# Patient Record
Sex: Female | Born: 1970 | Race: White | Hispanic: No | Marital: Married | Smoking: Never smoker
Health system: Southern US, Community
[De-identification: ages and names within clinical notes are randomized; demographics above are authoritative.]

## PROBLEM LIST (undated history)

## (undated) DIAGNOSIS — I7774 Dissection of vertebral artery: Secondary | ICD-10-CM

## (undated) DIAGNOSIS — R51 Headache: Secondary | ICD-10-CM

## (undated) DIAGNOSIS — R519 Headache, unspecified: Secondary | ICD-10-CM

## (undated) HISTORY — DX: Headache, unspecified: R51.9

## (undated) HISTORY — DX: Dissection of vertebral artery: I77.74

## (undated) HISTORY — DX: Headache: R51

## (undated) HISTORY — PX: WISDOM TOOTH EXTRACTION: SHX21

---

## 1995-03-02 HISTORY — PX: TONSILLECTOMY: SUR1361

## 1998-07-30 ENCOUNTER — Inpatient Hospital Stay (HOSPITAL_COMMUNITY): Admission: AD | Admit: 1998-07-30 | Discharge: 1998-08-01 | Payer: Self-pay | Admitting: *Deleted

## 1998-08-02 ENCOUNTER — Encounter (HOSPITAL_COMMUNITY): Admission: RE | Admit: 1998-08-02 | Discharge: 1998-10-31 | Payer: Self-pay | Admitting: *Deleted

## 2000-12-14 ENCOUNTER — Other Ambulatory Visit: Admission: RE | Admit: 2000-12-14 | Discharge: 2000-12-14 | Payer: Self-pay | Admitting: *Deleted

## 2001-07-11 ENCOUNTER — Inpatient Hospital Stay (HOSPITAL_COMMUNITY): Admission: AD | Admit: 2001-07-11 | Discharge: 2001-07-13 | Payer: Self-pay | Admitting: Obstetrics and Gynecology

## 2001-08-15 ENCOUNTER — Other Ambulatory Visit: Admission: RE | Admit: 2001-08-15 | Discharge: 2001-08-15 | Payer: Self-pay | Admitting: Obstetrics and Gynecology

## 2002-09-25 ENCOUNTER — Other Ambulatory Visit: Admission: RE | Admit: 2002-09-25 | Discharge: 2002-09-25 | Payer: Self-pay | Admitting: Obstetrics and Gynecology

## 2003-12-09 ENCOUNTER — Other Ambulatory Visit: Admission: RE | Admit: 2003-12-09 | Discharge: 2003-12-09 | Payer: Self-pay | Admitting: Obstetrics and Gynecology

## 2004-05-08 ENCOUNTER — Inpatient Hospital Stay (HOSPITAL_COMMUNITY): Admission: AD | Admit: 2004-05-08 | Discharge: 2004-05-15 | Payer: Self-pay | Admitting: Obstetrics and Gynecology

## 2004-05-08 ENCOUNTER — Ambulatory Visit: Payer: Self-pay | Admitting: Neonatology

## 2004-05-13 ENCOUNTER — Encounter (INDEPENDENT_AMBULATORY_CARE_PROVIDER_SITE_OTHER): Payer: Self-pay | Admitting: Specialist

## 2004-07-17 ENCOUNTER — Other Ambulatory Visit: Admission: RE | Admit: 2004-07-17 | Discharge: 2004-07-17 | Payer: Self-pay | Admitting: Obstetrics and Gynecology

## 2005-05-09 ENCOUNTER — Emergency Department (HOSPITAL_COMMUNITY): Admission: EM | Admit: 2005-05-09 | Discharge: 2005-05-10 | Payer: Self-pay | Admitting: Emergency Medicine

## 2009-04-18 ENCOUNTER — Ambulatory Visit (HOSPITAL_COMMUNITY): Admission: RE | Admit: 2009-04-18 | Discharge: 2009-04-18 | Payer: Self-pay | Admitting: Neurosurgery

## 2010-07-17 NOTE — Discharge Summary (Signed)
Ruth Cooper, Ruth Cooper            ACCOUNT NO.:  [id number]   MEDICAL RECORD NO.:  [id number]          PATIENT TYPE:  INP   LOCATION:  9148                          FACILITY:  [facility]   PHYSICIAN:  Michelle L. Grewal, M.D.DATE OF BIRTH:  1970-07-13   DATE OF ADMISSION:  05/08/2004  DATE OF DISCHARGE:  05/15/2004                                 DISCHARGE SUMMARY   ADMITTING DIAGNOSES:  1.  Intrauterine pregnancy at 51 and five-sevenths weeks estimated      gestational age.  2.  Pregnancy-induced hypertension.   DISCHARGE DIAGNOSES:  1.  Status post spontaneous vaginal delivery.  2.  Viable female infant.  3.  Chronic hypertension.  4.  Hemorrhoids.   PROCEDURE:  Spontaneous vaginal delivery.   REASON FOR ADMISSION:  Please see written H&P.   HOSPITAL COURSE:  The patient was a 40 year old gravida 3, para 2 married  white female that was admitted to [facility] at 62 and five-  sevenths weeks estimated gestational age for observation. The patient had  been seen in the office earlier on the day of admission with blood pressure  noted to be 138/100 with 1+ proteinuria. The patient did complain of a  headache and some increase in pedal edema. On admission, blood pressure was  150-163 over 87-100. Deep tendon reflexes were 2+ without clonus. Fetal  heart tones were reactive. The patient was placed on the monitor without  uterine contractions noted. Laboratory findings revealed platelet count of  119, SGOT 54, SGPT 55. Ultrasound revealed vertex presentation with AFI of  8.3, estimated fetal weight of 1828 g. The patient was admitted for external  fetal monitoring, betamethasone administration, serial lab work, and 24-hour  urine collection. 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  Amniocentesis was performed for evaluation of fetal maturity. Biophysical  profile was 8/8, L/S ratio  came back at 1.8:1 without PG. However, due to  worsening PIH with borderline lecithin/sphingomyelin ratio, decision was  made to proceed with induction of labor. The patient did progress to full  dilatation with spontaneous vaginal delivery of a viable female infant  weighing 3 pounds 8 ounces, Apgars of 8 at one minute, 10 at five minutes.  Arterial cord pH was 7.14. NICU was in attendance. Placenta was sent for  pathology, which was delivered spontaneously. Estimated blood loss was less  than 400 cc. The patient did sustain a second-degree perineal laceration was  repaired without difficulty. The patient and infant were in excellent  immediate postpartum condition. On postpartum day #1 the patient did  complain of some heartburn. She denied headache or blurred vision. Vital  signs were stable. Blood pressure 137/87 to 166/89. Deep tendon reflexes 2-  3+ without clonus. Abdomen was soft. Fundus was firm with a mild amount of  lochia. Perineum was intact. Laboratory findings revealed hemoglobin of  12.6; platelet count 164,000; wbc count of 13.2. Blood pressure was checked  often with PIH labs ordered for the afternoon. The patient was given some  Pepcid a.c. and h.s. Later that  afternoon, vital signs were stable with  blood pressure noted to be 155/90 to 132/87. Laboratory findings revealed  platelets up to 165,000. SGOT was down to 39, SGPT down to 46. On the  following morning, the patient was doing well with exception of a complaint  of a hemorrhoid. Vital signs were stable, blood pressure was improved. The  patient was noted to have a swollen, nonthrombosed hemorrhoid. Fundus was  firm and nontender, labs were improving, and the patient was later  discharged.   CONDITION ON DISCHARGE:  Stable.   DIET:  Regular as tolerated.   ACTIVITY:  No vaginal entry and bedrest.   FOLLOW UP:  The patient is to follow up in the office in approximately 4  days for lab work and blood pressure  check. She is to call for headache,  blurred vision, or right upper quadrant pain, or heavy vaginal bleeding.   DISCHARGE MEDICATIONS:  1.  Prenatal vitamins one p.o. daily.  2.  Labetalol 100 mg one p.o. b.i.d.  3.  Ibuprofen 600 mg every 6 hours p.r.n. cramps.      CC/MEDQ  D:  06/26/2004  T:  06/26/2004  Job:  [id number]

## 2011-06-14 ENCOUNTER — Encounter (INDEPENDENT_AMBULATORY_CARE_PROVIDER_SITE_OTHER): Payer: Self-pay | Admitting: Surgery

## 2013-05-28 ENCOUNTER — Ambulatory Visit (HOSPITAL_BASED_OUTPATIENT_CLINIC_OR_DEPARTMENT_OTHER)
Admission: RE | Admit: 2013-05-28 | Discharge: 2013-05-28 | Disposition: A | Discharge status: Home / Self Care | Payer: BC Managed Care – PPO | Source: Ambulatory Visit | Attending: Physician Assistant | Admitting: Physician Assistant

## 2013-05-28 ENCOUNTER — Encounter: Payer: Self-pay | Admitting: Physician Assistant

## 2013-05-28 ENCOUNTER — Ambulatory Visit (INDEPENDENT_AMBULATORY_CARE_PROVIDER_SITE_OTHER): Payer: BC Managed Care – PPO | Admitting: Physician Assistant

## 2013-05-28 VITALS — BP 118/79 | HR 76 | Temp 98.4°F | Resp 14 | Ht 66.0 in | Wt 141.4 lb

## 2013-05-28 DIAGNOSIS — M25469 Effusion, unspecified knee: Secondary | ICD-10-CM | POA: Insufficient documentation

## 2013-05-28 DIAGNOSIS — Z23 Encounter for immunization: Secondary | ICD-10-CM

## 2013-05-28 DIAGNOSIS — M25569 Pain in unspecified knee: Secondary | ICD-10-CM | POA: Insufficient documentation

## 2013-05-28 DIAGNOSIS — M25561 Pain in right knee: Secondary | ICD-10-CM

## 2013-05-28 DIAGNOSIS — Z7189 Other specified counseling: Secondary | ICD-10-CM

## 2013-05-28 DIAGNOSIS — Z7689 Persons encountering health services in other specified circumstances: Secondary | ICD-10-CM

## 2013-05-28 DIAGNOSIS — Z1239 Encounter for other screening for malignant neoplasm of breast: Secondary | ICD-10-CM

## 2013-05-28 NOTE — Progress Notes (Signed)
Patient presents to clinic today to establish care.  Patient complains of 1.5 weeks of pain in right knee without effusion after falling onto right knee.  Pain is intermittent and achy in nature.  Patient denies trauma to other extremity or to head during fall.  Fall was not preceded by [facility], dizziness or syncope. Denies twisting injury. Pain is present with use of stairs.  Walking down stairs is worse.  Has not taken anything for pain.  Denies pain elsewhere.  Health Maintenance: Dental -- UTD Vision -- Overdue Immunizations -- Flu shot in October; Needs Td booster Mammogram --referral to [facility] for Mammogram PAP -- 2009; no abnormal PAPs  Past Medical History  Diagnosis Date  . Frequent headaches   . Vertebral artery dissection     Past Surgical History  Procedure Laterality Date  . Tonsillectomy  1997  . Wisdom tooth extraction      No current outpatient prescriptions on file prior to visit.   No current facility-administered medications on file prior to visit.    No Known Allergies  Family History  Problem Relation Age of Onset  . Colon cancer Mother     Living  . Colon cancer Maternal Grandfather   . Breast cancer Mother   . Breast cancer Maternal Grandmother   . Heart disease Paternal Grandfather   . Healthy Father     Living  . Hypertension Paternal Grandfather   . Alzheimer's disease Paternal Grandmother   . Healthy Brother     x1  . Healthy Son     x2  . Allergies Son   . Healthy Daughter     x1    History   Social History  . Marital Status: Married    Spouse Name: N/A    Number of Children: N/A  . Years of Education: N/A   Occupational History  . Not on file.   Social History Main Topics  . Smoking status: Never Smoker   . Smokeless tobacco: Never Used  . Alcohol Use: No  . Drug Use: No  . Sexual Activity: Not Currently   Other Topics Concern  . Not on file   Social History Narrative   Patient works as Contractor.       Patient currently married with 3 children, all healthy.       -- 15, 12, 9            Review of Systems  Constitutional: Negative for fever and weight loss.  HENT: Negative for ear discharge, ear pain, hearing loss and tinnitus.   Eyes: Positive for blurred vision. Negative for double vision, photophobia and pain.  Respiratory: Negative for cough and shortness of breath.   Cardiovascular: Negative for chest pain and palpitations.  Gastrointestinal: Negative for heartburn, nausea, vomiting, abdominal pain, diarrhea, constipation, blood in stool and melena.  Genitourinary: Negative for dysuria, urgency, frequency, hematuria and flank pain.  Neurological: Negative for dizziness and loss of consciousness.  Psychiatric/Behavioral: Negative for depression, suicidal ideas, hallucinations and substance abuse. The patient is not nervous/anxious and does not have insomnia.    BP 118/79  Pulse 76  Temp(Src) 98.4 F (36.9 C) (Oral)  Resp 14  Ht 5\' 6"  (1.676 m)  Wt 141 lb 6 oz (64.127 kg)  BMI 22.83 kg/m2  SpO2 98%  LMP 05/18/2013  Physical Exam  Vitals reviewed. Constitutional: She is oriented to person, place, and time and well-developed, well-nourished, and in no distress.  HENT:  Head: Normocephalic and atraumatic.  Right Ear: External ear normal.  Left Ear: External ear normal.  Nose: Nose normal.  Mouth/Throat: Oropharynx is clear and moist. No oropharyngeal exudate.  Eyes: Conjunctivae are normal. Pupils are equal, round, and reactive to light.  Neck: Neck supple.  Cardiovascular: Normal rate, regular rhythm, normal heart sounds and intact distal pulses.   Pulmonary/Chest: Effort normal and breath sounds normal. No respiratory distress. She has no wheezes. She has no rales. She exhibits no tenderness.  Musculoskeletal:       Right hip: Normal.       Right knee: Normal.       Right ankle: Normal.       Right upper leg: Normal.       Right lower leg: Normal.       Right  foot: Normal.  Lymphadenopathy:    She has no cervical adenopathy.  Neurological: She is alert and oriented to person, place, and time.  Skin: Skin is warm and dry. No rash noted.  Psychiatric: Affect normal.   Assessment/Plan: Encounter to establish care Medical history reviewed.  PAP overdue. Patient to schedule appointment for annual exam and pelvic exam.  Patient to come fasting for labs.  Tetanus booster given by nursing staff. [facility].  Knee pain, right Physical exam unremarkable.  Will obtain x-ray.  Knee support brace given.  Alternate tylenol and ibuprofen.  Topical Aspercreme. Activity as tolerated.  Discussed PT as an option.  Patient declines at present.  If symptoms persist, will refer to Orthopedics for MRI and further treatment.  Need for prophylactic vaccination with tetanus-diphtheria (TD) Immunization given by nursing staff.

## 2013-05-28 NOTE — Patient Instructions (Signed)
Please go to the [facility].  The address is [address]  The Imaging department is located on the first floor, just right of the elevator.  I will call you with your results.  Please wear knee brace daily.  May take off at home while resting.  Keep leg elevated.  Alternate tylenol and ibuprofen if needed for pain.  Apply heat to knee for 15 minutes, 3-4 times per day. If symptoms persist, we will need to obtain MRI and send you to see an orthopedist.  Follow-up in 2-3 weeks.  Also please schedule appointment with annual exam with fasting labs.  Please consider Mammogram.  It was a pleasure participating in your care today.  Preventive Care for Adults, Female A healthy lifestyle and preventive care can promote health and wellness. Preventive health guidelines for women include the following key practices.  A routine yearly physical is a good way to check with your health care provider about your health and preventive screening. It is a chance to share any concerns and updates on your health and to receive a thorough exam.  Visit your dentist for a routine exam and preventive care every 6 months. Brush your teeth twice a day and floss once a day. Good oral hygiene prevents tooth decay and gum disease.  The frequency of eye exams is based on your age, health, family medical history, use of contact lenses, and other factors. Follow your health care provider's recommendations for frequency of eye exams.  Eat a healthy diet. Foods like vegetables, fruits, whole grains, low-fat dairy products, and lean protein foods contain the nutrients you need without too many calories. Decrease your intake of foods high in solid fats, added sugars, and salt. Eat the right amount of calories for you.Get information about a proper diet from your health care provider, if necessary.  Regular physical exercise is one of the most important things you can do for your health. Most adults should get at least  150 minutes of moderate-intensity exercise (any activity that increases your heart rate and causes you to sweat) each week. In addition, most adults need muscle-strengthening exercises on 2 or more days a week.  Maintain a healthy weight. The body mass index (BMI) is a screening tool to identify possible weight problems. It provides an estimate of body fat based on height and weight. Your health care provider can find your BMI, and can help you achieve or maintain a healthy weight.For adults 20 years and older:  A BMI below 18.5 is considered underweight.  A BMI of 18.5 to 24.9 is normal.  A BMI of 25 to 29.9 is considered overweight.  A BMI of 30 and above is considered obese.  Maintain normal blood lipids and cholesterol levels by exercising and minimizing your intake of saturated fat. Eat a balanced diet with plenty of fruit and vegetables. Blood tests for lipids and cholesterol should begin at age 74 and be repeated every 5 years. If your lipid or cholesterol levels are high, you are over 50, or you are at high risk for heart disease, you may need your cholesterol levels checked more frequently.Ongoing high lipid and cholesterol levels should be treated with medicines if diet and exercise are not working.  If you smoke, find out from your health care provider how to quit. If you do not use tobacco, do not start.  Lung cancer screening is recommended for adults aged 59 80 years who are at high risk for developing lung  cancer because of a history of smoking. A yearly low-dose CT scan of the lungs is recommended for people who have at least a 30-pack-year history of smoking and are a current smoker or have quit within the past 15 years. A pack year of smoking is smoking an average of 1 pack of cigarettes a day for 1 year (for example: 1 pack a day for 30 years or 2 packs a day for 15 years). Yearly screening should continue until the smoker has stopped smoking for at least 15 years. Yearly  screening should be stopped for people who develop a health problem that would prevent them from having lung cancer treatment.  If you are pregnant, do not drink alcohol. If you are breastfeeding, be very cautious about drinking alcohol. If you are not pregnant and choose to drink alcohol, do not have more than 1 drink per day. One drink is considered to be 12 ounces (355 mL) of beer, 5 ounces (148 mL) of wine, or 1.5 ounces (44 mL) of liquor.  Avoid use of street drugs. Do not share needles with anyone. Ask for help if you need support or instructions about stopping the use of drugs.  High blood pressure causes heart disease and increases the risk of stroke. Your blood pressure should be checked at least every 1 to 2 years. Ongoing high blood pressure should be treated with medicines if weight loss and exercise do not work.  If you are 43 43 years old, ask your health care provider if you should take aspirin to prevent strokes.  Diabetes screening involves taking a blood sample to check your fasting blood sugar level. This should be done once every 3 years, after age 26, if you are within normal weight and without risk factors for diabetes. Testing should be considered at a younger age or be carried out more frequently if you are overweight and have at least 1 risk factor for diabetes.  Breast cancer screening is essential preventive care for women. You should practice "breast self-awareness." This means understanding the normal appearance and feel of your breasts and may include breast self-examination. Any changes detected, no matter how small, should be reported to a health care provider. Women in their 75s and 30s should have a clinical breast exam (CBE) by a health care provider as part of a regular health exam every 1 to 3 years. After age 29, women should have a CBE every year. Starting at age 59, women should consider having a mammogram (breast X-ray test) every year. Women who have a family  history of breast cancer should talk to their health care provider about genetic screening. Women at a high risk of breast cancer should talk to their health care providers about having an MRI and a mammogram every year.  Breast cancer gene (BRCA)-related cancer risk assessment is recommended for women who have family members with BRCA-related cancers. BRCA-related cancers include breast, ovarian, tubal, and peritoneal cancers. Having family members with these cancers may be associated with an increased risk for harmful changes (mutations) in the breast cancer genes BRCA1 and BRCA2. Results of the assessment will determine the need for genetic counseling and BRCA1 and BRCA2 testing.  The Pap test is a screening test for cervical cancer. A Pap test can show cell changes on the cervix that might become cervical cancer if left untreated. A Pap test is a procedure in which cells are obtained and examined from the lower end of the uterus (cervix).  Women should  have a Pap test starting at age 10.  Between ages 66 and 40, Pap tests should be repeated every 2 years.  Beginning at age 68, you should have a Pap test every 3 years as long as the past 3 Pap tests have been normal.  Some women have medical problems that increase the chance of getting cervical cancer. Talk to your health care provider about these problems. It is especially important to talk to your health care provider if a new problem develops soon after your last Pap test. In these cases, your health care provider may recommend more frequent screening and Pap tests.  The above recommendations are the same for women who have or have not gotten the vaccine for human papillomavirus (HPV).  If you had a hysterectomy for a problem that was not cancer or a condition that could lead to cancer, then you no longer need Pap tests. Even if you no longer need a Pap test, a regular exam is a good idea to make sure no other problems are starting.  If you  are between ages 37 and 22 years, and you have had normal Pap tests going back 10 years, you no longer need Pap tests. Even if you no longer need a Pap test, a regular exam is a good idea to make sure no other problems are starting.  If you have had past treatment for cervical cancer or a condition that could lead to cancer, you need Pap tests and screening for cancer for at least 20 years after your treatment.  If Pap tests have been discontinued, risk factors (such as a new sexual partner) need to be reassessed to determine if screening should be resumed.  The HPV test is an additional test that may be used for cervical cancer screening. The HPV test looks for the virus that can cause the cell changes on the cervix. The cells collected during the Pap test can be tested for HPV. The HPV test could be used to screen women aged 82 years and older, and should be used in women of any age who have unclear Pap test results. After the age of 32, women should have HPV testing at the same frequency as a Pap test.  Colorectal cancer can be detected and often prevented. Most routine colorectal cancer screening begins at the age of 49 years and continues through age 58 years. However, your health care provider may recommend screening at an earlier age if you have risk factors for colon cancer. On a yearly basis, your health care provider may provide home test kits to check for hidden blood in the stool. Use of a small camera at the end of a tube, to directly examine the colon (sigmoidoscopy or colonoscopy), can detect the earliest forms of colorectal cancer. Talk to your health care provider about this at age 48, when routine screening begins. Direct exam of the colon should be repeated every 5 10 years through age 29 years, unless early forms of pre-cancerous polyps or small growths are found.  People who are at an increased risk for hepatitis B should be screened for this virus. You are considered at high risk for  hepatitis B if:  You were born in a country where hepatitis B occurs often. Talk with your health care provider about which countries are considered high risk.  Your parents were born in a high-risk country and you have not received a shot to protect against hepatitis B (hepatitis B vaccine).  You have  HIV or AIDS.  You use needles to inject street drugs.  You live with, or have sex with, someone who has Hepatitis B.  You get hemodialysis treatment.  You take certain medicines for conditions like cancer, organ transplantation, and autoimmune conditions.  Hepatitis C blood testing is recommended for all people born from [birth year] through 1965 and any individual with known risks for hepatitis C.  Practice safe sex. Use condoms and avoid high-risk sexual practices to reduce the spread of sexually transmitted infections (STIs). STIs include gonorrhea, chlamydia, syphilis, trichomonas, herpes, HPV, and human immunodeficiency virus (HIV). Herpes, HIV, and HPV are viral illnesses that have no cure. They can result in disability, cancer, and death. Sexually active women aged 4 years and younger should be checked for chlamydia. Older women with new or multiple partners should also be tested for chlamydia. Testing for other STIs is recommended if you are sexually active and at increased risk.  Osteoporosis is a disease in which the bones lose minerals and strength with aging. This can result in serious bone fractures or breaks. The risk of osteoporosis can be identified using a bone density scan. Women ages 36 years and over and women at risk for fractures or osteoporosis should discuss screening with their health care providers. Ask your health care provider whether you should take a calcium supplement or vitamin D to reduce the rate of osteoporosis.  Menopause can be associated with physical symptoms and risks. Hormone replacement therapy is available to decrease symptoms and risks. You should talk to your  health care provider about whether hormone replacement therapy is right for you.  Use sunscreen. Apply sunscreen liberally and repeatedly throughout the day. You should seek shade when your shadow is shorter than you. Protect yourself by wearing long sleeves, pants, a wide-brimmed hat, and sunglasses year round, whenever you are outdoors.  Once a month, do a whole body skin exam, using a mirror to look at the skin on your back. Tell your health care provider of new moles, moles that have irregular borders, moles that are larger than a pencil eraser, or moles that have changed in shape or color.  Stay current with required vaccines (immunizations).  Influenza vaccine. All adults should be immunized every year.  Tetanus, diphtheria, and acellular pertussis (Td, Tdap) vaccine. Pregnant women should receive 1 dose of Tdap vaccine during each pregnancy. The dose should be obtained regardless of the length of time since the last dose. Immunization is preferred during the 27th 36th week of gestation. An adult who has not previously received Tdap or who does not know her vaccine status should receive 1 dose of Tdap. This initial dose should be followed by tetanus and diphtheria toxoids (Td) booster doses every 10 years. Adults with an unknown or incomplete history of completing a 3-dose immunization series with Td-containing vaccines should begin or complete a primary immunization series including a Tdap dose. Adults should receive a Td booster every 10 years.  Varicella vaccine. An adult without evidence of immunity to varicella should receive 2 doses or a second dose if she has previously received 1 dose. Pregnant females who do not have evidence of immunity should receive the first dose after pregnancy. This first dose should be obtained before leaving the health care facility. The second dose should be obtained 4 8 weeks after the first dose.  Human papillomavirus (HPV) vaccine. Females aged 86 26 years  who have not received the vaccine previously should obtain the 3-dose series. The vaccine is  not recommended for use in pregnant females. However, pregnancy testing is not needed before receiving a dose. If a female is found to be pregnant after receiving a dose, no treatment is needed. In that case, the remaining doses should be delayed until after the pregnancy. Immunization is recommended for any person with an immunocompromised condition through the age of 27 years if she did not get any or all doses earlier. During the 3-dose series, the second dose should be obtained 4 8 weeks after the first dose. The third dose should be obtained 24 weeks after the first dose and 16 weeks after the second dose.  Zoster vaccine. One dose is recommended for adults aged 36 years or older unless certain conditions are present.  Measles, mumps, and rubella (MMR) vaccine. Adults born before [birth year] generally are considered immune to measles and mumps. Adults born in [birth year] or later should have 1 or more doses of MMR vaccine unless there is a contraindication to the vaccine or there is laboratory evidence of immunity to each of the three diseases. A routine second dose of MMR vaccine should be obtained at least 28 days after the first dose for students attending postsecondary schools, health care workers, or international travelers. People who received inactivated measles vaccine or an unknown type of measles vaccine during 1963 1967 should receive 2 doses of MMR vaccine. People who received inactivated mumps vaccine or an unknown type of mumps vaccine before 1979 and are at high risk for mumps infection should consider immunization with 2 doses of MMR vaccine. For females of childbearing age, rubella immunity should be determined. If there is no evidence of immunity, females who are not pregnant should be vaccinated. If there is no evidence of immunity, females who are pregnant should delay immunization until after pregnancy.  Unvaccinated health care workers born before [birth year] who lack laboratory evidence of measles, mumps, or rubella immunity or laboratory confirmation of disease should consider measles and mumps immunization with 2 doses of MMR vaccine or rubella immunization with 1 dose of MMR vaccine.  Pneumococcal 13-valent conjugate (PCV13) vaccine. When indicated, a person who is uncertain of her immunization history and has no record of immunization should receive the PCV13 vaccine. An adult aged 13 years or older who has certain medical conditions and has not been previously immunized should receive 1 dose of PCV13 vaccine. This PCV13 should be followed with a dose of pneumococcal polysaccharide (PPSV23) vaccine. The PPSV23 vaccine dose should be obtained at least 8 weeks after the dose of PCV13 vaccine. An adult aged 5 years or older who has certain medical conditions and previously received 1 or more doses of PPSV23 vaccine should receive 1 dose of PCV13. The PCV13 vaccine dose should be obtained 1 or more years after the last PPSV23 vaccine dose.  Pneumococcal polysaccharide (PPSV23) vaccine. When PCV13 is also indicated, PCV13 should be obtained first. All adults aged 7 years and older should be immunized. An adult younger than age 61 years who has certain medical conditions should be immunized. Any person who resides in a nursing home or long-term care facility should be immunized. An adult smoker should be immunized. People with an immunocompromised condition and certain other conditions should receive both PCV13 and PPSV23 vaccines. People with human immunodeficiency virus (HIV) infection should be immunized as soon as possible after diagnosis. Immunization during chemotherapy or radiation therapy should be avoided. Routine use of PPSV23 vaccine is not recommended for American Indians, [place] Natives, or people younger than 78  years unless there are medical conditions that require PPSV23 vaccine. When indicated,  people who have unknown immunization and have no record of immunization should receive PPSV23 vaccine. One-time revaccination 5 years after the first dose of PPSV23 is recommended for people aged 86 64 years who have chronic kidney failure, nephrotic syndrome, asplenia, or immunocompromised conditions. People who received 1 2 doses of PPSV23 before age 83 years should receive another dose of PPSV23 vaccine at age 80 years or later if at least 5 years have passed since the previous dose. Doses of PPSV23 are not needed for people immunized with PPSV23 at or after age 5 years.  Meningococcal vaccine. Adults with asplenia or persistent complement component deficiencies should receive 2 doses of quadrivalent meningococcal conjugate (MenACWY-D) vaccine. The doses should be obtained at least 2 months apart. Microbiologists working with certain meningococcal bacteria, [place] recruits, people at risk during an outbreak, and people who travel to or live in countries with a high rate of meningitis should be immunized. A first-year college student up through age 62 years who is living in a residence hall should receive a dose if she did not receive a dose on or after her 16th birthday. Adults who have certain high-risk conditions should receive one or more doses of vaccine.  Hepatitis A vaccine. Adults who wish to be protected from this disease, have certain high-risk conditions, work with hepatitis A-infected animals, work in hepatitis A research labs, or travel to or work in countries with a high rate of hepatitis A should be immunized. Adults who were previously unvaccinated and who anticipate close contact with an international adoptee during the first 60 days after arrival in the [place] from a country with a high rate of hepatitis A should be immunized.  Hepatitis B vaccine. Adults who wish to be protected from this disease, have certain high-risk conditions, may be exposed to blood or other infectious body  fluids, are household contacts or sex partners of hepatitis B positive people, are clients or workers in certain care facilities, or travel to or work in countries with a high rate of hepatitis B should be immunized.  Haemophilus influenzae type b (Hib) vaccine. A previously unvaccinated person with asplenia or sickle cell disease or having a scheduled splenectomy should receive 1 dose of Hib vaccine. Regardless of previous immunization, a recipient of a hematopoietic stem cell transplant should receive a 3-dose series 6 12 months after her successful transplant. Hib vaccine is not recommended for adults with HIV infection. Preventive Services / Frequency Ages 19 to 39years  Blood pressure check.** / Every 1 to 2 years.  Lipid and cholesterol check.** / Every 5 years beginning at age 94.  Clinical breast exam.** / Every 3 years for women in their 79s and 73s.  BRCA-related cancer risk assessment.** / For women who have family members with a BRCA-related cancer (breast, ovarian, tubal, or peritoneal cancers).  Pap test.** / Every 2 years from ages 32 through 30. Every 3 years starting at age 9 through age 72 or 35 with a history of 3 consecutive normal Pap tests.  HPV screening.** / Every 3 years from ages 59 through ages 78 to 2 with a history of 3 consecutive normal Pap tests.  Hepatitis C blood test.** / For any individual with known risks for hepatitis C.  Skin self-exam. / Monthly.  Influenza vaccine. / Every year.  Tetanus, diphtheria, and acellular pertussis (Tdap, Td) vaccine.** / Consult your health care provider. Pregnant women should receive  1 dose of Tdap vaccine during each pregnancy. 1 dose of Td every 10 years.  Varicella vaccine.** / Consult your health care provider. Pregnant females who do not have evidence of immunity should receive the first dose after pregnancy.  HPV vaccine. / 3 doses over 6 months, if 30 and younger. The vaccine is not recommended for use in  pregnant females. However, pregnancy testing is not needed before receiving a dose.  Measles, mumps, rubella (MMR) vaccine.** / You need at least 1 dose of MMR if you were born in 1957 or later. You may also need a 2nd dose. For females of childbearing age, rubella immunity should be determined. If there is no evidence of immunity, females who are not pregnant should be vaccinated. If there is no evidence of immunity, females who are pregnant should delay immunization until after pregnancy.  Pneumococcal 13-valent conjugate (PCV13) vaccine.** / Consult your health care provider.  Pneumococcal polysaccharide (PPSV23) vaccine.** / 1 to 2 doses if you smoke cigarettes or if you have certain conditions.  Meningococcal vaccine.** / 1 dose if you are age 25 to 86 years and a Market researcher living in a residence hall, or have one of several medical conditions, you need to get vaccinated against meningococcal disease. You may also need additional booster doses.  Hepatitis A vaccine.** / Consult your health care provider.  Hepatitis B vaccine.** / Consult your health care provider.  Haemophilus influenzae type b (Hib) vaccine.** / Consult your health care provider. Ages 68 to 64years  Blood pressure check.** / Every 1 to 2 years.  Lipid and cholesterol check.** / Every 5 years beginning at age 8 years.  Lung cancer screening. / Every year if you are aged 20 80 years and have a 30-pack-year history of smoking and currently smoke or have quit within the past 15 years. Yearly screening is stopped once you have quit smoking for at least 15 years or develop a health problem that would prevent you from having lung cancer treatment.  Clinical breast exam.** / Every year after age 68 years.  BRCA-related cancer risk assessment.** / For women who have family members with a BRCA-related cancer (breast, ovarian, tubal, or peritoneal cancers).  Mammogram.** / Every year beginning at age 30 years  and continuing for as long as you are in good health. Consult with your health care provider.  Pap test.** / Every 3 years starting at age 21 years through age 61 or 27 years with a history of 3 consecutive normal Pap tests.  HPV screening.** / Every 3 years from ages 53 years through ages 80 to 39 years with a history of 3 consecutive normal Pap tests.  Fecal occult blood test (FOBT) of stool. / Every year beginning at age 51 years and continuing until age 1 years. You may not need to do this test if you get a colonoscopy every 10 years.  Flexible sigmoidoscopy or colonoscopy.** / Every 5 years for a flexible sigmoidoscopy or every 10 years for a colonoscopy beginning at age 47 years and continuing until age 41 years.  Hepatitis C blood test.** / For all people born from [birth year] through 1965 and any individual with known risks for hepatitis C.  Skin self-exam. / Monthly.  Influenza vaccine. / Every year.  Tetanus, diphtheria, and acellular pertussis (Tdap/Td) vaccine.** / Consult your health care provider. Pregnant women should receive 1 dose of Tdap vaccine during each pregnancy. 1 dose of Td every 10 years.  Varicella vaccine.** / Consult  your health care provider. Pregnant females who do not have evidence of immunity should receive the first dose after pregnancy.  Zoster vaccine.** / 1 dose for adults aged 54 years or older.  Measles, mumps, rubella (MMR) vaccine.** / You need at least 1 dose of MMR if you were born in 1957 or later. You may also need a 2nd dose. For females of childbearing age, rubella immunity should be determined. If there is no evidence of immunity, females who are not pregnant should be vaccinated. If there is no evidence of immunity, females who are pregnant should delay immunization until after pregnancy.  Pneumococcal 13-valent conjugate (PCV13) vaccine.** / Consult your health care provider.  Pneumococcal polysaccharide (PPSV23) vaccine.** / 1 to 2 doses if you  smoke cigarettes or if you have certain conditions.  Meningococcal vaccine.** / Consult your health care provider.  Hepatitis A vaccine.** / Consult your health care provider.  Hepatitis B vaccine.** / Consult your health care provider.  Haemophilus influenzae type b (Hib) vaccine.** / Consult your health care provider. Ages 59 years and over  Blood pressure check.** / Every 1 to 2 years.  Lipid and cholesterol check.** / Every 5 years beginning at age 35 years.  Lung cancer screening. / Every year if you are aged 31 80 years and have a 30-pack-year history of smoking and currently smoke or have quit within the past 15 years. Yearly screening is stopped once you have quit smoking for at least 15 years or develop a health problem that would prevent you from having lung cancer treatment.  Clinical breast exam.** / Every year after age 62 years.  BRCA-related cancer risk assessment.** / For women who have family members with a BRCA-related cancer (breast, ovarian, tubal, or peritoneal cancers).  Mammogram.** / Every year beginning at age 23 years and continuing for as long as you are in good health. Consult with your health care provider.  Pap test.** / Every 3 years starting at age 74 years through age 7 or 23 years with 3 consecutive normal Pap tests. Testing can be stopped between 65 and 70 years with 3 consecutive normal Pap tests and no abnormal Pap or HPV tests in the past 10 years.  HPV screening.** / Every 3 years from ages 63 years through ages 24 or 51 years with a history of 3 consecutive normal Pap tests. Testing can be stopped between 65 and 70 years with 3 consecutive normal Pap tests and no abnormal Pap or HPV tests in the past 10 years.  Fecal occult blood test (FOBT) of stool. / Every year beginning at age 65 years and continuing until age 56 years. You may not need to do this test if you get a colonoscopy every 10 years.  Flexible sigmoidoscopy or colonoscopy.** / Every  5 years for a flexible sigmoidoscopy or every 10 years for a colonoscopy beginning at age 67 years and continuing until age 10 years.  Hepatitis C blood test.** / For all people born from [birth year] through 1965 and any individual with known risks for hepatitis C.  Osteoporosis screening.** / A one-time screening for women ages 25 years and over and women at risk for fractures or osteoporosis.  Skin self-exam. / Monthly.  Influenza vaccine. / Every year.  Tetanus, diphtheria, and acellular pertussis (Tdap/Td) vaccine.** / 1 dose of Td every 10 years.  Varicella vaccine.** / Consult your health care provider.  Zoster vaccine.** / 1 dose for adults aged 29 years or older.  Pneumococcal 13-valent  conjugate (PCV13) vaccine.** / Consult your health care provider.  Pneumococcal polysaccharide (PPSV23) vaccine.** / 1 dose for all adults aged 91 years and older.  Meningococcal vaccine.** / Consult your health care provider.  Hepatitis A vaccine.** / Consult your health care provider.  Hepatitis B vaccine.** / Consult your health care provider.  Haemophilus influenzae type b (Hib) vaccine.** / Consult your health care provider. ** Family history and personal history of risk and conditions may change your health care provider's recommendations. Document Released: 04/13/2001 Document Revised: 12/06/2012 Document Reviewed: 07/13/2010 [facility] Patient Information 2014 [place], [place].

## 2013-05-28 NOTE — Progress Notes (Signed)
Pre visit review using our clinic review tool, if applicable. No additional management support is needed unless otherwise documented below in the visit note/SLS  

## 2013-06-03 ENCOUNTER — Encounter: Payer: Self-pay | Admitting: Physician Assistant

## 2013-06-03 DIAGNOSIS — Z23 Encounter for immunization: Secondary | ICD-10-CM | POA: Insufficient documentation

## 2013-06-03 DIAGNOSIS — Z7689 Persons encountering health services in other specified circumstances: Secondary | ICD-10-CM | POA: Insufficient documentation

## 2013-06-03 DIAGNOSIS — M25561 Pain in right knee: Secondary | ICD-10-CM | POA: Insufficient documentation

## 2013-06-03 NOTE — Assessment & Plan Note (Signed)
Medical history reviewed.  PAP overdue. Patient to schedule appointment for annual exam and pelvic exam.  Patient to come fasting for labs.  Tetanus booster given by nursing staff. [facility].

## 2013-06-03 NOTE — Assessment & Plan Note (Signed)
Immunization given by nursing staff. 

## 2013-06-03 NOTE — Assessment & Plan Note (Signed)
Physical exam unremarkable.  Will obtain x-ray.  Knee support brace given.  Alternate tylenol and ibuprofen.  Topical Aspercreme. Activity as tolerated.  Discussed PT as an option.  Patient declines at present.  If symptoms persist, will refer to Orthopedics for MRI and further treatment.

## 2013-11-23 ENCOUNTER — Encounter: Payer: Self-pay | Admitting: Family Medicine

## 2013-11-23 ENCOUNTER — Ambulatory Visit (INDEPENDENT_AMBULATORY_CARE_PROVIDER_SITE_OTHER): Payer: BC Managed Care – PPO | Admitting: Family Medicine

## 2013-11-23 ENCOUNTER — Other Ambulatory Visit (HOSPITAL_COMMUNITY)
Admission: RE | Admit: 2013-11-23 | Discharge: 2013-11-23 | Disposition: A | Discharge status: Home / Self Care | Payer: BC Managed Care – PPO | Source: Ambulatory Visit | Attending: Family Medicine | Admitting: Family Medicine

## 2013-11-23 VITALS — BP 124/84 | HR 79 | Temp 98.0°F | Resp 16 | Ht 66.5 in | Wt 145.0 lb

## 2013-11-23 DIAGNOSIS — Z8349 Family history of other endocrine, nutritional and metabolic diseases: Secondary | ICD-10-CM

## 2013-11-23 DIAGNOSIS — Z113 Encounter for screening for infections with a predominantly sexual mode of transmission: Secondary | ICD-10-CM | POA: Insufficient documentation

## 2013-11-23 DIAGNOSIS — N760 Acute vaginitis: Secondary | ICD-10-CM | POA: Insufficient documentation

## 2013-11-23 DIAGNOSIS — Z800 Family history of malignant neoplasm of digestive organs: Secondary | ICD-10-CM | POA: Insufficient documentation

## 2013-11-23 DIAGNOSIS — Z01419 Encounter for gynecological examination (general) (routine) without abnormal findings: Secondary | ICD-10-CM | POA: Insufficient documentation

## 2013-11-23 DIAGNOSIS — Z1151 Encounter for screening for human papillomavirus (HPV): Secondary | ICD-10-CM | POA: Diagnosis present

## 2013-11-23 DIAGNOSIS — Z23 Encounter for immunization: Secondary | ICD-10-CM

## 2013-11-23 DIAGNOSIS — Z803 Family history of malignant neoplasm of breast: Secondary | ICD-10-CM | POA: Insufficient documentation

## 2013-11-23 DIAGNOSIS — Z124 Encounter for screening for malignant neoplasm of cervix: Secondary | ICD-10-CM | POA: Insufficient documentation

## 2013-11-23 LAB — BASIC METABOLIC PANEL
BUN: 14 mg/dL (ref 6–23)
CO2: 24 mEq/L (ref 19–32)
Calcium: 9 mg/dL (ref 8.4–10.5)
Chloride: 104 mEq/L (ref 96–112)
Creatinine, Ser: 0.7 mg/dL (ref 0.4–1.2)
GFR: 96.98 mL/min (ref >60.00)
Glucose, Bld: 87 mg/dL (ref 70–99)
Potassium: 3.7 mEq/L (ref 3.5–5.1)
Sodium: 132 mEq/L — ABNORMAL LOW (ref 135–145)

## 2013-11-23 LAB — CBC WITH DIFFERENTIAL/PLATELET
Basophils Absolute: 0 10*3/uL (ref 0.0–0.1)
Basophils Relative: 0.5 % (ref 0.0–3.0)
Eosinophils Absolute: 0.2 10*3/uL (ref 0.0–0.7)
Eosinophils Relative: 4.3 % (ref 0.0–5.0)
HCT: 40.2 % (ref 36.0–46.0)
Hemoglobin: 13.5 g/dL (ref 12.0–15.0)
Lymphocytes Relative: 25 % (ref 12.0–46.0)
Lymphs Abs: 1.2 10*3/uL (ref 0.7–4.0)
MCHC: 33.6 g/dL (ref 30.0–36.0)
MCV: 92.1 fl (ref 78.0–100.0)
Monocytes Absolute: 0.4 10*3/uL (ref 0.1–1.0)
Monocytes Relative: 9 % (ref 3.0–12.0)
Neutro Abs: 3 10*3/uL (ref 1.4–7.7)
Neutrophils Relative %: 61.2 % (ref 43.0–77.0)
Platelets: 161 10*3/uL (ref 150.0–400.0)
RBC: 4.37 Mil/uL (ref 3.87–5.11)
RDW: 13.5 % (ref 11.5–15.5)
WBC: 4.8 10*3/uL (ref 4.0–10.5)

## 2013-11-23 LAB — HEPATIC FUNCTION PANEL
ALT: 9 U/L (ref 0–35)
AST: 18 U/L (ref 0–37)
Albumin: 4.4 g/dL (ref 3.5–5.2)
Alkaline Phosphatase: 55 U/L (ref 39–117)
Bilirubin, Direct: 0.1 mg/dL (ref 0.0–0.3)
Total Bilirubin: 0.8 mg/dL (ref 0.2–1.2)
Total Protein: 7.4 g/dL (ref 6.0–8.3)

## 2013-11-23 LAB — LIPID PANEL
Cholesterol: 154 mg/dL (ref 0–200)
HDL: 54.2 mg/dL (ref >39.00)
LDL Cholesterol: 91 mg/dL (ref 0–99)
NonHDL: 99.8
Total CHOL/HDL Ratio: 3
Triglycerides: 43 mg/dL (ref 0.0–149.0)
VLDL: 8.6 mg/dL (ref 0.0–40.0)

## 2013-11-23 LAB — VITAMIN D 25 HYDROXY (VIT D DEFICIENCY, FRACTURES): VITD: 66.2 ng/mL (ref 30.00–100.00)

## 2013-11-23 LAB — IBC PANEL
Iron: 53 ug/dL (ref 42–145)
Saturation Ratios: 13.4 % — ABNORMAL LOW (ref 20.0–50.0)
Transferrin: 283.1 mg/dL (ref 212.0–360.0)

## 2013-11-23 LAB — TSH: TSH: 0.99 u[IU]/mL (ref 0.35–4.50)

## 2013-11-23 LAB — FERRITIN: Ferritin: 9.4 ng/mL — ABNORMAL LOW (ref 10.0–291.0)

## 2013-11-23 NOTE — Progress Notes (Signed)
Pre visit review using our clinic review tool, if applicable. No additional management support is needed unless otherwise documented below in the visit note. 

## 2013-11-23 NOTE — Patient Instructions (Signed)
Follow up in 1 year or as needed We'll notify you of your mammogram appt Please schedule your colonoscopy at your convenience We'll notify you of your lab results and make any changes if needed Keep up the good work!  You look great! Call with any questions or concerns Welcome!  We're glad to have you!

## 2013-11-23 NOTE — Progress Notes (Signed)
   Subjective:    Patient ID: Ruth Cooper, female    DOB: October 08, 1970, 43 y.o.   MRN: [id number]  HPI Transfer care.  Previous GYN- [facility].  Last pap and mammo 2011.  CPE- had colonoscopy 5 yrs ago w/ [facility] due to mom's hx of colon cancer.  Pt is due for repeat screen.   Review of Systems Patient reports no vision/ hearing changes, adenopathy,fever, weight change,  persistant/recurrent hoarseness , swallowing issues, chest pain, palpitations, edema, persistant/recurrent cough, hemoptysis, dyspnea (rest/exertional/paroxysmal nocturnal), gastrointestinal bleeding (melena, rectal bleeding), abdominal pain, significant heartburn, bowel changes, GU symptoms (dysuria, hematuria, incontinence), Gyn symptoms (abnormal  bleeding, pain),  syncope, focal weakness, memory loss, numbness & tingling, skin/hair/nail changes, abnormal bruising or bleeding, anxiety, or depression.     Objective:   Physical Exam  General Appearance:    Alert, cooperative, no distress, appears stated age  Head:    Normocephalic, without obvious abnormality, atraumatic  Eyes:    PERRL, conjunctiva/corneas clear, EOM's intact, fundi    benign, both eyes  Ears:    Normal TM's and external ear canals, both ears  Nose:   Nares normal, septum midline, mucosa normal, no drainage    or sinus tenderness  Throat:   Lips, mucosa, and tongue normal; teeth and gums normal  Neck:   Supple, symmetrical, trachea midline, no adenopathy;    Thyroid: no enlargement/tenderness/nodules  Back:     Symmetric, no curvature, ROM normal, no CVA tenderness  Lungs:     Clear to auscultation bilaterally, respirations unlabored  Chest Wall:    No tenderness or deformity   Heart:    Regular rate and rhythm, S1 and S2 normal, no murmur, rub   or gallop  Breast Exam:    No tenderness, masses, or nipple abnormality  Abdomen:     Soft, non-tender, bowel sounds active all four quadrants,    no masses, no organomegaly  Genitalia:    External  genitalia normal, cervix w/ ragged external appearance, no CMT, uterus in normal size and position, adnexa w/out mass or tenderness, mucosa pink and moist, no lesions or discharge present  Rectal:    Normal external appearance  Extremities:   Extremities normal, atraumatic, no cyanosis or edema  Pulses:   2+ and symmetric all extremities  Skin:   Skin color, texture, turgor normal, no rashes or lesions  Lymph nodes:   Cervical, supraclavicular, and axillary nodes normal  Neurologic:   CNII-XII intact, normal strength, sensation and reflexes    throughout          Assessment & Plan:

## 2013-11-24 NOTE — Assessment & Plan Note (Signed)
New.  Encouraged pt to schedule f/u colonoscopy at her convenience.

## 2013-11-24 NOTE — Assessment & Plan Note (Signed)
Pap collected. 

## 2013-11-24 NOTE — Assessment & Plan Note (Signed)
Pt's PE WNL.  Pt to get mammo and repeat colonoscopy.  Check labs.  Anticipatory guidance provided.

## 2013-11-24 NOTE — Assessment & Plan Note (Signed)
New to provider.  Order entered for mammo.

## 2013-11-26 ENCOUNTER — Other Ambulatory Visit: Payer: Self-pay | Admitting: Family Medicine

## 2013-11-26 DIAGNOSIS — E871 Hypo-osmolality and hyponatremia: Secondary | ICD-10-CM

## 2013-11-27 ENCOUNTER — Telehealth: Payer: Self-pay | Admitting: Family Medicine

## 2013-11-27 LAB — CYTOLOGY - PAP

## 2013-11-27 NOTE — Telephone Encounter (Signed)
Pt's mother dropped off handwritten note along w/ records from Dr Donnella Bi office.  Mom's biggest concerns appear to be w/ pt's mildly low sodium and repeating MRI/MRA of brain to assess for aneurysm/fibromuscular dysplasia.  In regards to these concerns- 1) we are repeating the metabolic panel to assess the sodium level.  Unless labs are extreme, we tend not to act based on 1 lab.  2) I read both of Dr Donnella Bi notes (pre and post MRI/MRA) along w/ the imaging results.  As of 05/02/2009, pt was told to consider repeating MRI/MRA in 10 yrs (2021) and to follow up w/ them as needed.  Based on this recommendation, I don't think insurance will approve a repeat scan w/o obvious cause.  I would be happy to discuss this w/ both the pt and her mother if they want to schedule an appt.

## 2013-11-27 NOTE — Telephone Encounter (Signed)
Called pt and lmovm to return call.

## 2013-11-28 LAB — CERVICOVAGINAL ANCILLARY ONLY
Bacterial vaginitis: NEGATIVE
Candida vaginitis: NEGATIVE

## 2013-11-28 NOTE — Telephone Encounter (Signed)
Pt notified of Tabori's recommendations. Advised to keep a log a memory loss, dizziness, and syncopal episodes (what she ate, how long it lasted, symptoms). Pt made an appt for 2 weeks to follow up. Advised to bring mom with her and if symptoms persist or worsen to seek treatment at the ER.

## 2013-11-28 NOTE — Telephone Encounter (Signed)
Patient returned phone call. Best # [phone]

## 2013-11-29 ENCOUNTER — Encounter: Payer: Self-pay | Admitting: General Practice

## 2013-12-14 ENCOUNTER — Ambulatory Visit
Admission: RE | Admit: 2013-12-14 | Discharge: 2013-12-14 | Disposition: A | Discharge status: Home / Self Care | Payer: BC Managed Care – PPO | Source: Ambulatory Visit | Attending: Family Medicine | Admitting: Family Medicine

## 2013-12-14 DIAGNOSIS — Z803 Family history of malignant neoplasm of breast: Secondary | ICD-10-CM

## 2013-12-17 ENCOUNTER — Ambulatory Visit: Payer: BC Managed Care – PPO | Admitting: Family Medicine

## 2013-12-17 ENCOUNTER — Other Ambulatory Visit: Payer: Self-pay | Admitting: Family Medicine

## 2013-12-17 DIAGNOSIS — R928 Other abnormal and inconclusive findings on diagnostic imaging of breast: Secondary | ICD-10-CM

## 2013-12-25 ENCOUNTER — Telehealth: Payer: Self-pay | Admitting: Family Medicine

## 2013-12-25 NOTE — Telephone Encounter (Signed)
Caller name: Rachna, Schonberger Relation to pt: self  Call back number: [phone]   Reason for call:   as per pt insurance 12-12-2013 lab was not coded as preventive and she was under the impression it was a physical. Please advise

## 2013-12-26 NOTE — Telephone Encounter (Signed)
Everything was coded as part of her physical exam except for the iron panel and ferritin which was linked to family hx of hemachromatosis b/c these tests aren't included in the routine physical

## 2013-12-28 NOTE — Telephone Encounter (Signed)
Spoke with patient and advised per PCP notes. Patient will check with her insurance and get a breakdown of what they did and did not pay.

## 2014-01-01 ENCOUNTER — Ambulatory Visit
Admission: RE | Admit: 2014-01-01 | Discharge: 2014-01-01 | Disposition: A | Discharge status: Home / Self Care | Payer: BC Managed Care – PPO | Source: Ambulatory Visit | Attending: Family Medicine | Admitting: Family Medicine

## 2014-01-01 DIAGNOSIS — R928 Other abnormal and inconclusive findings on diagnostic imaging of breast: Secondary | ICD-10-CM

## 2014-01-04 ENCOUNTER — Other Ambulatory Visit: Payer: BC Managed Care – PPO

## 2014-01-11 ENCOUNTER — Other Ambulatory Visit: Payer: Self-pay | Admitting: Gastroenterology

## 2014-01-11 LAB — HM COLONOSCOPY

## 2014-01-22 ENCOUNTER — Encounter: Payer: Self-pay | Admitting: General Practice

## 2014-01-25 ENCOUNTER — Encounter: Payer: Self-pay | Admitting: Family Medicine

## 2014-02-05 ENCOUNTER — Encounter: Payer: Self-pay | Admitting: Family Medicine

## 2014-02-11 ENCOUNTER — Encounter: Payer: Self-pay | Admitting: Family Medicine

## 2014-02-12 ENCOUNTER — Encounter: Payer: Self-pay | Admitting: Family Medicine

## 2014-11-29 ENCOUNTER — Encounter: Payer: BC Managed Care – PPO | Admitting: Family Medicine

## 2015-09-29 ENCOUNTER — Other Ambulatory Visit: Payer: Self-pay | Admitting: Physician Assistant

## 2015-09-29 ENCOUNTER — Other Ambulatory Visit: Payer: Self-pay | Admitting: Family Medicine

## 2015-09-29 DIAGNOSIS — Z1231 Encounter for screening mammogram for malignant neoplasm of breast: Secondary | ICD-10-CM

## 2015-09-29 DIAGNOSIS — Z803 Family history of malignant neoplasm of breast: Secondary | ICD-10-CM

## 2015-10-03 ENCOUNTER — Ambulatory Visit
Admission: RE | Admit: 2015-10-03 | Discharge: 2015-10-03 | Disposition: A | Discharge status: Home / Self Care | Payer: BLUE CROSS/BLUE SHIELD | Source: Ambulatory Visit | Attending: Physician Assistant | Admitting: Physician Assistant

## 2015-10-03 DIAGNOSIS — Z803 Family history of malignant neoplasm of breast: Secondary | ICD-10-CM

## 2015-10-03 DIAGNOSIS — Z1231 Encounter for screening mammogram for malignant neoplasm of breast: Secondary | ICD-10-CM

## 2015-10-07 ENCOUNTER — Other Ambulatory Visit: Payer: Self-pay | Admitting: Physician Assistant

## 2015-10-07 DIAGNOSIS — R928 Other abnormal and inconclusive findings on diagnostic imaging of breast: Secondary | ICD-10-CM

## 2015-10-22 ENCOUNTER — Ambulatory Visit
Admission: RE | Admit: 2015-10-22 | Discharge: 2015-10-22 | Disposition: A | Discharge status: Home / Self Care | Payer: BLUE CROSS/BLUE SHIELD | Source: Ambulatory Visit | Attending: Physician Assistant | Admitting: Physician Assistant

## 2015-10-22 DIAGNOSIS — R928 Other abnormal and inconclusive findings on diagnostic imaging of breast: Secondary | ICD-10-CM

## 2015-10-31 ENCOUNTER — Encounter: Payer: Self-pay | Admitting: Physician Assistant

## 2015-10-31 ENCOUNTER — Ambulatory Visit (INDEPENDENT_AMBULATORY_CARE_PROVIDER_SITE_OTHER): Payer: BLUE CROSS/BLUE SHIELD | Admitting: Physician Assistant

## 2015-10-31 VITALS — BP 124/80 | HR 80 | Temp 97.9°F | Ht 66.5 in | Wt 148.0 lb

## 2015-10-31 DIAGNOSIS — R413 Other amnesia: Secondary | ICD-10-CM

## 2015-10-31 DIAGNOSIS — Z0001 Encounter for general adult medical examination with abnormal findings: Secondary | ICD-10-CM

## 2015-10-31 DIAGNOSIS — G43009 Migraine without aura, not intractable, without status migrainosus: Secondary | ICD-10-CM

## 2015-10-31 DIAGNOSIS — Z0000 Encounter for general adult medical examination without abnormal findings: Secondary | ICD-10-CM

## 2015-10-31 LAB — CBC
HCT: 41.9 % (ref 36.0–46.0)
Hemoglobin: 14.1 g/dL (ref 12.0–15.0)
MCHC: 33.7 g/dL (ref 30.0–36.0)
MCV: 85.5 fl (ref 78.0–100.0)
Platelets: 176 10*3/uL (ref 150.0–400.0)
RBC: 4.91 Mil/uL (ref 3.87–5.11)
RDW: 15.4 % (ref 11.5–15.5)
WBC: 5.2 10*3/uL (ref 4.0–10.5)

## 2015-10-31 LAB — LIPID PANEL
Cholesterol: 176 mg/dL (ref 0–200)
HDL: 61.3 mg/dL (ref >39.00)
LDL Cholesterol: 103 mg/dL — ABNORMAL HIGH (ref 0–99)
NonHDL: 114.51
Total CHOL/HDL Ratio: 3
Triglycerides: 56 mg/dL (ref 0.0–149.0)
VLDL: 11.2 mg/dL (ref 0.0–40.0)

## 2015-10-31 LAB — COMPREHENSIVE METABOLIC PANEL
ALT: 7 U/L (ref 0–35)
AST: 14 U/L (ref 0–37)
Albumin: 4.6 g/dL (ref 3.5–5.2)
Alkaline Phosphatase: 59 U/L (ref 39–117)
BUN: 13 mg/dL (ref 6–23)
CO2: 29 mEq/L (ref 19–32)
Calcium: 9.1 mg/dL (ref 8.4–10.5)
Chloride: 104 mEq/L (ref 96–112)
Creatinine, Ser: 0.75 mg/dL (ref 0.40–1.20)
GFR: 88.76 mL/min (ref >60.00)
Glucose, Bld: 90 mg/dL (ref 70–99)
Potassium: 4 mEq/L (ref 3.5–5.1)
Sodium: 138 mEq/L (ref 135–145)
Total Bilirubin: 0.6 mg/dL (ref 0.2–1.2)
Total Protein: 7.4 g/dL (ref 6.0–8.3)

## 2015-10-31 LAB — URINALYSIS, ROUTINE W REFLEX MICROSCOPIC
Bilirubin Urine: NEGATIVE
Ketones, ur: NEGATIVE
Leukocytes, UA: NEGATIVE
Nitrite: NEGATIVE
Specific Gravity, Urine: 1.025 (ref 1.000–1.030)
Total Protein, Urine: NEGATIVE
Urine Glucose: NEGATIVE
Urobilinogen, UA: 0.2 (ref 0.0–1.0)
pH: 6 (ref 5.0–8.0)

## 2015-10-31 LAB — TSH: TSH: 1.07 u[IU]/mL (ref 0.35–4.50)

## 2015-10-31 LAB — VITAMIN B12: Vitamin B-12: 465 pg/mL (ref 211–911)

## 2015-10-31 LAB — HEMOGLOBIN A1C: Hgb A1c MFr Bld: 5.3 % (ref 4.6–6.5)

## 2015-10-31 MED ORDER — SUMATRIPTAN SUCCINATE 25 MG PO TABS: 25.0000 mg | ORAL_TABLET | Freq: Once | ORAL | 0 refills | Status: DC

## 2015-10-31 NOTE — Progress Notes (Signed)
Patient presents to clinic today for annual exam.  Patient is fasting for labs.  Acute Concerns: Patient endorses some difficulty with forgetfulness. Frequently misplaces keys or forgets work bag. 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  Does note stressors with family's health issues. Denies anxiety or depressed mood. Endorses well-balanced diet. Denies family history of . Denies family or personal history of thyroid disorder.  Patient also endorses history of headaches, worsening over the past 6 months. Denies vision changes. Had appointment in December with Optometry and was told vision was 20/20. Notes headaches occurring 1-2 times every month. Endorses nausea with headache but denies vomiting. Notes photophobia. Notes headaches typically start in the middle of the night and wake her from sleep, with significant pain behind right eye. Marland Kitchen Has had history of chronic sinusitis but denies any sinus symptoms.   Health Maintenance: Immunizations -- Declines flu shot.  Colonoscopy -- + family history of colorectal cancer (mother). Colonoscopy last in 2015. On every 5 year schedule.  Mammogram -- Last earlier this year. Dense breast tissue.  PAP -- Last in 2015. Normal. Denies history of abnormal PAP smear.   Past Medical History:  Diagnosis Date  . Frequent headaches   . 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    Past Surgical History:  Procedure Laterality Date  . TONSILLECTOMY  1997  . WISDOM TOOTH EXTRACTION      No current outpatient prescriptions on file prior to visit.   No current facility-administered medications on file prior to visit.     No Known Allergies  Family History  Problem Relation Age of Onset  . Colon cancer Mother     Living  . Breast cancer Mother   . Colon cancer Maternal Grandfather   . Breast cancer Maternal Grandmother   . Heart disease Paternal  Grandfather   . Hypertension Paternal Grandfather   . Healthy Father     Living  . Alzheimer's disease Paternal Grandmother   . Healthy Brother     x1  . Healthy Son     x2  . Allergies Son   . Healthy Daughter     x1    Social History   Social History  . Marital status: Married    Spouse name: N/A  . Number of children: N/A  . Years of education: N/A   Occupational History  . Not on file.   Social History Main Topics  . Smoking status: Never Smoker  . Smokeless tobacco: Never Used  . Alcohol use No  . Drug use: No  . Sexual activity: Not Currently   Other Topics Concern  . Not on file   Social History Narrative   Patient works as Contractor.        Patient currently married with 3 children, all healthy.       -- 15, 12, 9            Review of Systems  Constitutional: Negative for fever and weight loss.  HENT: Negative for ear discharge, ear pain, hearing loss and tinnitus.   Eyes: Positive for pain. Negative for blurred vision, double vision and photophobia.  Respiratory: Negative for cough and shortness of breath.   Cardiovascular: Negative for chest pain and palpitations.  Gastrointestinal: Positive for nausea. Negative for abdominal pain, blood in stool, constipation, diarrhea, heartburn, melena and vomiting.  Genitourinary: Negative for dysuria, flank  pain, frequency, hematuria and urgency.  Musculoskeletal: Negative for falls.  Neurological: Positive for headaches. Negative for dizziness and loss of consciousness.  Endo/Heme/Allergies: Negative for environmental allergies.  Psychiatric/Behavioral: Negative for depression, hallucinations, substance abuse and suicidal ideas. The patient is not nervous/anxious and does not have insomnia.    BP 124/80 (BP Location: Right Arm, Patient Position: Sitting, Cuff Size: Small)   Pulse 80   Temp 97.9 F (36.6 C) (Oral)   Ht 5' 6.5" (1.689 m)   Wt 148 lb (67.1 kg)   LMP 10/26/2015   SpO2 99%   BMI  23.53 kg/m   Physical Exam  Constitutional: She is oriented to person, place, and time and well-developed, well-nourished, and in no distress.  HENT:  Head: Normocephalic and atraumatic.  Right Ear: Tympanic membrane, external ear and ear canal normal.  Left Ear: Tympanic membrane, external ear and ear canal normal.  Nose: Nose normal. No mucosal edema.  Mouth/Throat: Uvula is midline, oropharynx is clear and moist and mucous membranes are normal. No oropharyngeal exudate or posterior oropharyngeal erythema.  Eyes: Conjunctivae are normal. Pupils are equal, round, and reactive to light.  Neck: Neck supple. No thyromegaly present.  Cardiovascular: Normal rate, regular rhythm, normal heart sounds and intact distal pulses.   Pulmonary/Chest: Effort normal and breath sounds normal. No respiratory distress. She has no wheezes. She has no rales.  Abdominal: Soft. Bowel sounds are normal. She exhibits no distension and no mass. There is no tenderness. There is no rebound and no guarding.  Lymphadenopathy:    She has no cervical adenopathy.  Neurological: She is alert and oriented to person, place, and time. She has normal reflexes and intact cranial nerves. She displays normal speech. No cranial nerve deficit. She has a normal Cerebellar Exam. Gait normal.  Skin: Skin is warm and dry. No rash noted.  Psychiatric: Affect normal.  Vitals reviewed.  Assessment/Plan: Visit for preventive health examination Depression screen negative. Health Maintenance reviewed -- Declines flu shot. PAP, mammogram and colonoscopy up-to-date. Preventive schedule discussed and handout given in AVS. Will obtain fasting labs today.   Migraine without aura and without status migrainosus, not intractable Overall headaches seem consistent with migraine. Atypical feature is sometimes starting during sleep. Only occurring 1-2 x month. Neuro exam intact today. Referral to Neurology placed. Rx Imitrex for migraine abortive  therapy.  Memory loss MMSE with score of 24 which is normal but still feel low considering her young age. Will obtain lab panel today. Referral to Neurology placed.    Leeanne Rio, PA-C

## 2015-10-31 NOTE — Progress Notes (Signed)
Pre visit review using our clinic review tool, if applicable. No additional management support is needed unless otherwise documented below in the visit note. 

## 2015-10-31 NOTE — Assessment & Plan Note (Signed)
MMSE with score of 24 which is normal but still feel low considering her young age. Will obtain lab panel today. Referral to Neurology placed.

## 2015-10-31 NOTE — Assessment & Plan Note (Signed)
Overall headaches seem consistent with migraine. Atypical feature is sometimes starting during sleep. Only occurring 1-2 x month. Neuro exam intact today. Referral to Neurology placed. Rx Imitrex for migraine abortive therapy.

## 2015-10-31 NOTE — Assessment & Plan Note (Signed)
Depression screen negative. Health Maintenance reviewed -- Declines flu shot. PAP, mammogram and colonoscopy up-to-date. Preventive schedule discussed and handout given in AVS. Will obtain fasting labs today.

## 2015-10-31 NOTE — Patient Instructions (Addendum)
Please go to the lab for blood work.   Our office will call you with your results unless you have chosen to receive results via MyChart.  If your blood work is normal we will follow-up each year for physicals and as scheduled for chronic medical problems.  If anything is abnormal we will treat accordingly and get you in for a follow-up.  Please take the Imitrex as directed to help resolve the headache. Stay well hydrated. A little caffeine can also help with these headaches.   I am setting you up with Neurology for further assessment of your memory.   Preventive Care for Adults, Female A healthy lifestyle and preventive care can promote health and wellness. Preventive health guidelines for women include the following key practices.  A routine yearly physical is a good way to check with your health care provider about your health and preventive screening. It is a chance to share any concerns and updates on your health and to receive a thorough exam.  Visit your dentist for a routine exam and preventive care every 6 months. Brush your teeth twice a day and floss once a day. Good oral hygiene prevents tooth decay and gum disease.  The frequency of eye exams is based on your age, health, family medical history, use of contact lenses, and other factors. Follow your health care provider's recommendations for frequency of eye exams.  Eat a healthy diet. Foods like vegetables, fruits, whole grains, low-fat dairy products, and lean protein foods contain the nutrients you need without too many calories. Decrease your intake of foods high in solid fats, added sugars, and salt. Eat the right amount of calories for you.Get information about a proper diet from your health care provider, if necessary.  Regular physical exercise is one of the most important things you can do for your health. Most adults should get at least 150 minutes of moderate-intensity exercise (any activity that increases your heart  rate and causes you to sweat) each week. In addition, most adults need muscle-strengthening exercises on 2 or more days a week.  Maintain a healthy weight. The body mass index (BMI) is a screening tool to identify possible weight problems. It provides an estimate of body fat based on height and weight. Your health care provider can find your BMI and can help you achieve or maintain a healthy weight.For adults 20 years and older:  A BMI below 18.5 is considered underweight.  A BMI of 18.5 to 24.9 is normal.  A BMI of 25 to 29.9 is considered overweight.  A BMI of 30 and above is considered obese.  Maintain normal blood lipids and cholesterol levels by exercising and minimizing your intake of saturated fat. Eat a balanced diet with plenty of fruit and vegetables. Blood tests for lipids and cholesterol should begin at age 45 and be repeated every 5 years. If your lipid or cholesterol levels are high, you are over 50, or you are at high risk for heart disease, you may need your cholesterol levels checked more frequently.Ongoing high lipid and cholesterol levels should be treated with medicines if diet and exercise are not working.  If you smoke, find out from your health care provider how to quit. If you do not use tobacco, do not start.  Lung cancer screening is recommended for adults aged 50-80 years who are at high risk for developing lung cancer because of a history of smoking. A yearly low-dose CT scan of the lungs is recommended for people who  have at least a 30-pack-year history of smoking and are a current smoker or have quit within the past 15 years. A pack year of smoking is smoking an average of 1 pack of cigarettes a day for 1 year (for example: 1 pack a day for 30 years or 2 packs a day for 15 years). Yearly screening should continue until the smoker has stopped smoking for at least 15 years. Yearly screening should be stopped for people who develop a health problem that would prevent them  from having lung cancer treatment.  If you are pregnant, do not drink alcohol. If you are breastfeeding, be very cautious about drinking alcohol. If you are not pregnant and choose to drink alcohol, do not have more than 1 drink per day. One drink is considered to be 12 ounces (355 mL) of beer, 5 ounces (148 mL) of wine, or 1.5 ounces (44 mL) of liquor.  Avoid use of street drugs. Do not share needles with anyone. Ask for help if you need support or instructions about stopping the use of drugs.  High blood pressure causes heart disease and increases the risk of stroke. Your blood pressure should be checked at least every 1 to 2 years. Ongoing high blood pressure should be treated with medicines if weight loss and exercise do not work.  If you are 45 years old, ask your health care provider if you should take aspirin to prevent strokes.  Diabetes screening is done by taking a blood sample to check your blood glucose level after you have not eaten for a certain period of time (fasting). If you are not overweight and you do not have risk factors for diabetes, you should be screened once every 3 years starting at age 12. If you are overweight or obese and you are 45 years of age, you should be screened for diabetes every year as part of your cardiovascular risk assessment.  Breast cancer screening is essential preventive care for women. You should practice "breast self-awareness." This means understanding the normal appearance and feel of your breasts and may include breast self-examination. Any changes detected, no matter how small, should be reported to a health care provider. Women in their 45s and 30s should have a clinical breast exam (CBE) by a health care provider as part of a regular health exam every 1 to 3 years. After age 11, women should have a CBE every year. Starting at age 45, women should consider having a mammogram (breast X-ray test) every year. Women who have a family history of  breast cancer should talk to their health care provider about genetic screening. Women at a high risk of breast cancer should talk to their health care providers about having an MRI and a mammogram every year.  Breast cancer gene (BRCA)-related cancer risk assessment is recommended for women who have family members with BRCA-related cancers. BRCA-related cancers include breast, ovarian, tubal, and peritoneal cancers. Having family members with these cancers may be associated with an increased risk for harmful changes (mutations) in the breast cancer genes BRCA1 and BRCA2. Results of the assessment will determine the need for genetic counseling and BRCA1 and BRCA2 testing.  Your health care provider may recommend that you be screened regularly for cancer of the pelvic organs (ovaries, uterus, and vagina). This screening involves a pelvic examination, including checking for microscopic changes to the surface of your cervix (Pap test). You may be encouraged to have this screening done every 3 years, beginning at age 12.  For women ages 77-65, health care providers may recommend pelvic exams and Pap testing every 3 years, or they may recommend the Pap and pelvic exam, combined with testing for human papilloma virus (HPV), every 5 years. Some types of HPV increase your risk of cervical cancer. Testing for HPV may also be done on women of any age with unclear Pap test results.  Other health care providers may not recommend any screening for nonpregnant women who are considered low risk for pelvic cancer and who do not have symptoms. Ask your health care provider if a screening pelvic exam is right for you.  If you have had past treatment for cervical cancer or a condition that could lead to cancer, you need Pap tests and screening for cancer for at least 20 years after your treatment. If Pap tests have been discontinued, your risk factors (such as having a new sexual partner) need to be reassessed to determine  if screening should resume. Some women have medical problems that increase the chance of getting cervical cancer. In these cases, your health care provider may recommend more frequent screening and Pap tests.  Colorectal cancer can be detected and often prevented. Most routine colorectal cancer screening begins at the age of 23 years and continues through age 66 years. However, your health care provider may recommend screening at an earlier age if you have risk factors for colon cancer. On a yearly basis, your health care provider may provide home test kits to check for hidden blood in the stool. Use of a small camera at the end of a tube, to directly examine the colon (sigmoidoscopy or colonoscopy), can detect the earliest forms of colorectal cancer. Talk to your health care provider about this at age 9, when routine screening begins. Direct exam of the colon should be repeated every 5-10 years through age 62 years, unless early forms of precancerous polyps or small growths are found.  People who are at an increased risk for hepatitis B should be screened for this virus. You are considered at high risk for hepatitis B if:  You were born in a country where hepatitis B occurs often. Talk with your health care provider about which countries are considered high risk.  Your parents were born in a high-risk country and you have not received a shot to protect against hepatitis B (hepatitis B vaccine).  You have HIV or AIDS.  You use needles to inject street drugs.  You live with, or have sex with, someone who has hepatitis B.  You get hemodialysis treatment.  You take certain medicines for conditions like cancer, organ transplantation, and autoimmune conditions.  Hepatitis C blood testing is recommended for all people born from [birth year] through 1965 and any individual with known risks for hepatitis C.  Practice safe sex. Use condoms and avoid high-risk sexual practices to reduce the spread of sexually  transmitted infections (STIs). STIs include gonorrhea, chlamydia, syphilis, trichomonas, herpes, HPV, and human immunodeficiency virus (HIV). Herpes, HIV, and HPV are viral illnesses that have no cure. They can result in disability, cancer, and death.  You should be screened for sexually transmitted illnesses (STIs) including gonorrhea and chlamydia if:  You are sexually active and are younger than 24 years.  You are older than 24 years and your health care provider tells you that you are at risk for this type of infection.  Your sexual activity has changed since you were last screened and you are at an increased risk for chlamydia or  gonorrhea. Ask your health care provider if you are at risk.  If you are at risk of being infected with HIV, it is recommended that you take a prescription medicine daily to prevent HIV infection. This is called preexposure prophylaxis (PrEP). You are considered at risk if:  You are sexually active and do not regularly use condoms or know the HIV status of your partner(s).  You take drugs by injection.  You are sexually active with a partner who has HIV.  Talk with your health care provider about whether you are at high risk of being infected with HIV. If you choose to begin PrEP, you should first be tested for HIV. You should then be tested every 3 months for as long as you are taking PrEP.  Osteoporosis is a disease in which the bones lose minerals and strength with aging. This can result in serious bone fractures or breaks. The risk of osteoporosis can be identified using a bone density scan. Women ages 10 years and over and women at risk for fractures or osteoporosis should discuss screening with their health care providers. Ask your health care provider whether you should take a calcium supplement or vitamin D to reduce the rate of osteoporosis.  Menopause can be associated with physical symptoms and risks. Hormone replacement therapy is available to decrease  symptoms and risks. You should talk to your health care provider about whether hormone replacement therapy is right for you.  Use sunscreen. Apply sunscreen liberally and repeatedly throughout the day. You should seek shade when your shadow is shorter than you. Protect yourself by wearing long sleeves, pants, a wide-brimmed hat, and sunglasses year round, whenever you are outdoors.  Once a month, do a whole body skin exam, using a mirror to look at the skin on your back. Tell your health care provider of new moles, moles that have irregular borders, moles that are larger than a pencil eraser, or moles that have changed in shape or color.  Stay current with required vaccines (immunizations).  Influenza vaccine. All adults should be immunized every year.  Tetanus, diphtheria, and acellular pertussis (Td, Tdap) vaccine. Pregnant women should receive 1 dose of Tdap vaccine during each pregnancy. The dose should be obtained regardless of the length of time since the last dose. Immunization is preferred during the 27th-36th week of gestation. An adult who has not previously received Tdap or who does not know her vaccine status should receive 1 dose of Tdap. This initial dose should be followed by tetanus and diphtheria toxoids (Td) booster doses every 10 years. Adults with an unknown or incomplete history of completing a 3-dose immunization series with Td-containing vaccines should begin or complete a primary immunization series including a Tdap dose. Adults should receive a Td booster every 10 years.  Varicella vaccine. An adult without evidence of immunity to varicella should receive 2 doses or a second dose if she has previously received 1 dose. Pregnant females who do not have evidence of immunity should receive the first dose after pregnancy. This first dose should be obtained before leaving the health care facility. The second dose should be obtained 4-8 weeks after the first dose.  Human  papillomavirus (HPV) vaccine. Females aged 13-26 years who have not received the vaccine previously should obtain the 3-dose series. The vaccine is not recommended for use in pregnant females. However, pregnancy testing is not needed before receiving a dose. If a female is found to be pregnant after receiving a dose, no treatment  is needed. In that case, the remaining doses should be delayed until after the pregnancy. Immunization is recommended for any person with an immunocompromised condition through the age of 10 years if she did not get any or all doses earlier. During the 3-dose series, the second dose should be obtained 4-8 weeks after the first dose. The third dose should be obtained 24 weeks after the first dose and 16 weeks after the second dose.  Zoster vaccine. One dose is recommended for adults aged 80 years or older unless certain conditions are present.  Measles, mumps, and rubella (MMR) vaccine. Adults born before [birth year] generally are considered immune to measles and mumps. Adults born in [birth year] or later should have 1 or more doses of MMR vaccine unless there is a contraindication to the vaccine or there is laboratory evidence of immunity to each of the three diseases. A routine second dose of MMR vaccine should be obtained at least 28 days after the first dose for students attending postsecondary schools, health care workers, or international travelers. People who received inactivated measles vaccine or an unknown type of measles vaccine during 1963-1967 should receive 2 doses of MMR vaccine. People who received inactivated mumps vaccine or an unknown type of mumps vaccine before 1979 and are at high risk for mumps infection should consider immunization with 2 doses of MMR vaccine. For females of childbearing age, rubella immunity should be determined. If there is no evidence of immunity, females who are not pregnant should be vaccinated. If there is no evidence of immunity, females who are pregnant  should delay immunization until after pregnancy. Unvaccinated health care workers born before [birth year] who lack laboratory evidence of measles, mumps, or rubella immunity or laboratory confirmation of disease should consider measles and mumps immunization with 2 doses of MMR vaccine or rubella immunization with 1 dose of MMR vaccine.  Pneumococcal 13-valent conjugate (PCV13) vaccine. When indicated, a person who is uncertain of his immunization history and has no record of immunization should receive the PCV13 vaccine. All adults 16 years of age and older should receive this vaccine. An adult aged 62 years or older who has certain medical conditions and has not been previously immunized should receive 1 dose of PCV13 vaccine. This PCV13 should be followed with a dose of pneumococcal polysaccharide (PPSV23) vaccine. Adults who are at high risk for pneumococcal disease should obtain the PPSV23 vaccine at least 8 weeks after the dose of PCV13 vaccine. Adults older than 44 years of age who have normal immune system function should obtain the PPSV23 vaccine dose at least 1 year after the dose of PCV13 vaccine.  Pneumococcal polysaccharide (PPSV23) vaccine. When PCV13 is also indicated, PCV13 should be obtained first. All adults aged 77 years and older should be immunized. An adult younger than age 62 years who has certain medical conditions should be immunized. Any person who resides in a nursing home or long-term care facility should be immunized. An adult smoker should be immunized. People with an immunocompromised condition and certain other conditions should receive both PCV13 and PPSV23 vaccines. People with human immunodeficiency virus (HIV) infection should be immunized as soon as possible after diagnosis. Immunization during chemotherapy or radiation therapy should be avoided. Routine use of PPSV23 vaccine is not recommended for American Indians, [place] Natives, or people younger than 65 years unless there are  medical conditions that require PPSV23 vaccine. When indicated, people who have unknown immunization and have no record of immunization should receive PPSV23 vaccine. One-time revaccination  5 years after the first dose of PPSV23 is recommended for people aged 19-64 years who have chronic kidney failure, nephrotic syndrome, asplenia, or immunocompromised conditions. People who received 1-2 doses of PPSV23 before age 34 years should receive another dose of PPSV23 vaccine at age 61 years or later if at least 5 years have passed since the previous dose. Doses of PPSV23 are not needed for people immunized with PPSV23 at or after age 65 years.  Meningococcal vaccine. Adults with asplenia or persistent complement component deficiencies should receive 2 doses of quadrivalent meningococcal conjugate (MenACWY-D) vaccine. The doses should be obtained at least 2 months apart. Microbiologists working with certain meningococcal bacteria, [place] recruits, people at risk during an outbreak, and people who travel to or live in countries with a high rate of meningitis should be immunized. A first-year college student up through age 35 years who is living in a residence hall should receive a dose if she did not receive a dose on or after her 16th birthday. Adults who have certain high-risk conditions should receive one or more doses of vaccine.  Hepatitis A vaccine. Adults who wish to be protected from this disease, have certain high-risk conditions, work with hepatitis A-infected animals, work in hepatitis A research labs, or travel to or work in countries with a high rate of hepatitis A should be immunized. Adults who were previously unvaccinated and who anticipate close contact with an international adoptee during the first 60 days after arrival in the [place] from a country with a high rate of hepatitis A should be immunized.  Hepatitis B vaccine. Adults who wish to be protected from this disease, have certain  high-risk conditions, may be exposed to blood or other infectious body fluids, are household contacts or sex partners of hepatitis B positive people, are clients or workers in certain care facilities, or travel to or work in countries with a high rate of hepatitis B should be immunized.  Haemophilus influenzae type b (Hib) vaccine. A previously unvaccinated person with asplenia or sickle cell disease or having a scheduled splenectomy should receive 1 dose of Hib vaccine. Regardless of previous immunization, a recipient of a hematopoietic stem cell transplant should receive a 3-dose series 6-12 months after her successful transplant. Hib vaccine is not recommended for adults with HIV infection. Preventive Services / Frequency Ages 35 to 55 years  Blood pressure check.** / Every 3-5 years.  Lipid and cholesterol check.** / Every 5 years beginning at age 37.  Clinical breast exam.** / Every 3 years for women in their 34s and 49s.  BRCA-related cancer risk assessment.** / For women who have family members with a BRCA-related cancer (breast, ovarian, tubal, or peritoneal cancers).  Pap test.** / Every 2 years from ages 42 through 40. Every 3 years starting at age 45 through age 74 or 16 with a history of 3 consecutive normal Pap tests.  HPV screening.** / Every 3 years from ages 37 through ages 9 to 7 with a history of 3 consecutive normal Pap tests.  Hepatitis C blood test.** / For any individual with known risks for hepatitis C.  Skin self-exam. / Monthly.  Influenza vaccine. / Every year.  Tetanus, diphtheria, and acellular pertussis (Tdap, Td) vaccine.** / Consult your health care provider. Pregnant women should receive 1 dose of Tdap vaccine during each pregnancy. 1 dose of Td every 10 years.  Varicella vaccine.** / Consult your health care provider. Pregnant females who do not have evidence of immunity should  receive the first dose after pregnancy.  HPV vaccine. / 3 doses over 6  months, if 46 and younger. The vaccine is not recommended for use in pregnant females. However, pregnancy testing is not needed before receiving a dose.  Measles, mumps, rubella (MMR) vaccine.** / You need at least 1 dose of MMR if you were born in 1957 or later. You may also need a 2nd dose. For females of childbearing age, rubella immunity should be determined. If there is no evidence of immunity, females who are not pregnant should be vaccinated. If there is no evidence of immunity, females who are pregnant should delay immunization until after pregnancy.  Pneumococcal 13-valent conjugate (PCV13) vaccine.** / Consult your health care provider.  Pneumococcal polysaccharide (PPSV23) vaccine.** / 1 to 2 doses if you smoke cigarettes or if you have certain conditions.  Meningococcal vaccine.** / 1 dose if you are age 62 to 38 years and a Market researcher living in a residence hall, or have one of several medical conditions, you need to get vaccinated against meningococcal disease. You may also need additional booster doses.  Hepatitis A vaccine.** / Consult your health care provider.  Hepatitis B vaccine.** / Consult your health care provider.  Haemophilus influenzae type b (Hib) vaccine.** / Consult your health care provider. Ages 37 to 48 years  Blood pressure check.** / Every year.  Lipid and cholesterol check.** / Every 5 years beginning at age 68 years.  Lung cancer screening. / Every year if you are aged 73-80 years and have a 30-pack-year history of smoking and currently smoke or have quit within the past 15 years. Yearly screening is stopped once you have quit smoking for at least 15 years or develop a health problem that would prevent you from having lung cancer treatment.  Clinical breast exam.** / Every year after age 49 years.  BRCA-related cancer risk assessment.** / For women who have family members with a BRCA-related cancer (breast, ovarian, tubal, or peritoneal  cancers).  Mammogram.** / Every year beginning at age 51 years and continuing for as long as you are in good health. Consult with your health care provider.  Pap test.** / Every 3 years starting at age 62 years through age 71 or 25 years with a history of 3 consecutive normal Pap tests.  HPV screening.** / Every 3 years from ages 27 years through ages 21 to 110 years with a history of 3 consecutive normal Pap tests.  Fecal occult blood test (FOBT) of stool. / Every year beginning at age 67 years and continuing until age 52 years. You may not need to do this test if you get a colonoscopy every 10 years.  Flexible sigmoidoscopy or colonoscopy.** / Every 5 years for a flexible sigmoidoscopy or every 10 years for a colonoscopy beginning at age 91 years and continuing until age 89 years.  Hepatitis C blood test.** / For all people born from [birth year] through 1965 and any individual with known risks for hepatitis C.  Skin self-exam. / Monthly.  Influenza vaccine. / Every year.  Tetanus, diphtheria, and acellular pertussis (Tdap/Td) vaccine.** / Consult your health care provider. Pregnant women should receive 1 dose of Tdap vaccine during each pregnancy. 1 dose of Td every 10 years.  Varicella vaccine.** / Consult your health care provider. Pregnant females who do not have evidence of immunity should receive the first dose after pregnancy.  Zoster vaccine.** / 1 dose for adults aged 2 years or older.  Measles, mumps, rubella (  MMR) vaccine.** / You need at least 1 dose of MMR if you were born in 1957 or later. You may also need a second dose. For females of childbearing age, rubella immunity should be determined. If there is no evidence of immunity, females who are not pregnant should be vaccinated. If there is no evidence of immunity, females who are pregnant should delay immunization until after pregnancy.  Pneumococcal 13-valent conjugate (PCV13) vaccine.** / Consult your health care  provider.  Pneumococcal polysaccharide (PPSV23) vaccine.** / 1 to 2 doses if you smoke cigarettes or if you have certain conditions.  Meningococcal vaccine.** / Consult your health care provider.  Hepatitis A vaccine.** / Consult your health care provider.  Hepatitis B vaccine.** / Consult your health care provider.  Haemophilus influenzae type b (Hib) vaccine.** / Consult your health care provider. Ages 60 years and over  Blood pressure check.** / Every year.  Lipid and cholesterol check.** / Every 5 years beginning at age 18 years.  Lung cancer screening. / Every year if you are aged 50-80 years and have a 30-pack-year history of smoking and currently smoke or have quit within the past 15 years. Yearly screening is stopped once you have quit smoking for at least 15 years or develop a health problem that would prevent you from having lung cancer treatment.  Clinical breast exam.** / Every year after age 27 years.  BRCA-related cancer risk assessment.** / For women who have family members with a BRCA-related cancer (breast, ovarian, tubal, or peritoneal cancers).  Mammogram.** / Every year beginning at age 64 years and continuing for as long as you are in good health. Consult with your health care provider.  Pap test.** / Every 3 years starting at age 52 years through age 7 or 86 years with 3 consecutive normal Pap tests. Testing can be stopped between 65 and 70 years with 3 consecutive normal Pap tests and no abnormal Pap or HPV tests in the past 10 years.  HPV screening.** / Every 3 years from ages 5 years through ages 51 or 35 years with a history of 3 consecutive normal Pap tests. Testing can be stopped between 65 and 70 years with 3 consecutive normal Pap tests and no abnormal Pap or HPV tests in the past 10 years.  Fecal occult blood test (FOBT) of stool. / Every year beginning at age 2 years and continuing until age 13 years. You may not need to do this test if you get a  colonoscopy every 10 years.  Flexible sigmoidoscopy or colonoscopy.** / Every 5 years for a flexible sigmoidoscopy or every 10 years for a colonoscopy beginning at age 39 years and continuing until age 17 years.  Hepatitis C blood test.** / For all people born from [birth year] through 1965 and any individual with known risks for hepatitis C.  Osteoporosis screening.** / A one-time screening for women ages 80 years and over and women at risk for fractures or osteoporosis.  Skin self-exam. / Monthly.  Influenza vaccine. / Every year.  Tetanus, diphtheria, and acellular pertussis (Tdap/Td) vaccine.** / 1 dose of Td every 10 years.  Varicella vaccine.** / Consult your health care provider.  Zoster vaccine.** / 1 dose for adults aged 41 years or older.  Pneumococcal 13-valent conjugate (PCV13) vaccine.** / Consult your health care provider.  Pneumococcal polysaccharide (PPSV23) vaccine.** / 1 dose for all adults aged 65 years and older.  Meningococcal vaccine.** / Consult your health care provider.  Hepatitis A vaccine.** / Consult your  health care provider.  Hepatitis B vaccine.** / Consult your health care provider.  Haemophilus influenzae type b (Hib) vaccine.** / Consult your health care provider. ** Family history and personal history of risk and conditions may change your health care provider's recommendations.   This information is not intended to replace advice given to you by your health care provider. Make sure you discuss any questions you have with your health care provider.   Document Released: 04/13/2001 Document Revised: 03/08/2014 Document Reviewed: 07/13/2010 Elsevier Interactive Patient Education [employer].

## 2015-11-01 LAB — RPR

## 2015-11-05 ENCOUNTER — Ambulatory Visit (INDEPENDENT_AMBULATORY_CARE_PROVIDER_SITE_OTHER): Payer: BLUE CROSS/BLUE SHIELD | Admitting: Neurology

## 2015-11-05 ENCOUNTER — Encounter: Payer: Self-pay | Admitting: Neurology

## 2015-11-05 DIAGNOSIS — G4485 Primary stabbing headache: Secondary | ICD-10-CM | POA: Diagnosis not present

## 2015-11-05 DIAGNOSIS — H5712 Ocular pain, left eye: Secondary | ICD-10-CM | POA: Diagnosis not present

## 2015-11-05 DIAGNOSIS — R51 Headache: Secondary | ICD-10-CM

## 2015-11-05 DIAGNOSIS — R519 Headache, unspecified: Secondary | ICD-10-CM

## 2015-11-05 DIAGNOSIS — I7774 Dissection of vertebral artery: Secondary | ICD-10-CM

## 2015-11-05 DIAGNOSIS — R311 Benign essential microscopic hematuria: Secondary | ICD-10-CM | POA: Insufficient documentation

## 2015-11-05 DIAGNOSIS — M542 Cervicalgia: Secondary | ICD-10-CM

## 2015-11-05 DIAGNOSIS — R413 Other amnesia: Secondary | ICD-10-CM

## 2015-11-05 MED ORDER — GABAPENTIN 100 MG PO CAPS: ORAL_CAPSULE | ORAL | 0 refills | Status: DC
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
## 2015-11-05 NOTE — Patient Instructions (Signed)
1.  We will check MRI of brain and MRA of head and neck to look for cause of headache and memory loss 2.  For headache and neck pain, we will start gabapentin 100mg :  Take 1 capsule twice daily (1 in AM and 1 at bedtime) for 1 week  Then 2 capsules twice daily for 1 week  Then 3 capsules twice daily. 3.  Use ice on neck for 20 minutes one to two times daily 4.  Follow up.  Further recommendations pending results.

## 2015-11-06 ENCOUNTER — Emergency Department (HOSPITAL_COMMUNITY): Payer: BLUE CROSS/BLUE SHIELD

## 2015-11-06 ENCOUNTER — Telehealth: Payer: Self-pay

## 2015-11-06 ENCOUNTER — Emergency Department (HOSPITAL_COMMUNITY)
Admission: EM | Admit: 2015-11-06 | Discharge: 2015-11-06 | Disposition: A | Discharge status: Home / Self Care | Payer: BLUE CROSS/BLUE SHIELD | Attending: Emergency Medicine | Admitting: Emergency Medicine

## 2015-11-06 DIAGNOSIS — M542 Cervicalgia: Secondary | ICD-10-CM

## 2015-11-06 DIAGNOSIS — Z8679 Personal history of other diseases of the circulatory system: Secondary | ICD-10-CM | POA: Insufficient documentation

## 2015-11-06 DIAGNOSIS — I7774 Dissection of vertebral artery: Secondary | ICD-10-CM

## 2015-11-06 MED ORDER — GADOBENATE DIMEGLUMINE 529 MG/ML IV SOLN
15.0000 mL | Freq: Once | INTRAVENOUS | Status: AC
  Administered 2015-11-06: 15 mL via INTRAVENOUS

## 2015-11-06 MED ORDER — METHOCARBAMOL 500 MG PO TABS: 500.0000 mg | ORAL_TABLET | Freq: Four times a day (QID) | ORAL | 0 refills | Status: DC

## 2015-11-06 MED ORDER — HYDROCODONE-ACETAMINOPHEN 5-325 MG PO TABS: 1.0000 | ORAL_TABLET | Freq: Four times a day (QID) | ORAL | 0 refills | Status: DC | PRN

## 2015-11-06 NOTE — ED Provider Notes (Signed)
Medical screening examination/treatment/procedure(s) were conducted as a shared visit with non-physician practitioner(s) and myself.  I personally evaluated the patient during the encounter.   EKG Interpretation None      The patient seen by me along with the physician assistant. Patient with over one week history of left-sided neck pain. Patient did have some left arm weakness today. Pain increases with movement. No pain to palpitation of the neck. Patient did have a big sneeze the day before. Patient has a remote history of a vertebral artery abnormality based on MRA from 2011. Patient has no history of any brain aneurysm however mother does. Patient currently without any numbness in left arm right arm or both legs currently no significant weakness to the left hand right hand or legs. Careful examination of the left hand shows no numbness to the hand good motor strength to flexion and extension spreading of the fingers and pinching of the index finger and little finger with the thumb.  Patient will get MRA of the neck area for further evaluation. Clinically suspect this may be a torticollis muscle spasm or perhaps a pinched cervical nerve.   Fredia Sorrow, MD 11/06/15 1058

## 2015-11-06 NOTE — Telephone Encounter (Signed)
Agree 

## 2015-11-06 NOTE — ED Notes (Signed)
Pt at MRI
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
## 2015-11-06 NOTE — Telephone Encounter (Signed)
Received call that patient was in severe pain. Has been up all night. Pain is in neck, head. Pt stated she had fibromuscular dysplasia, and history of 2 verterbral dissections.Requesting that scans (MRI brain w/o, MRA neck, and MRA head) be ordered as stat because pain is unbearable. Advised that unfortunately even as stat scans may not be immediate as we are still relying on availability, and if pain is unbearable then best plan of action is to seek emergency care.   Odessa Fleming, mother, called back. Stated that [facility] ([address] [place], [place] [address] ph: [phone] fax: [phone]) had an opening for today and asked that orders be faxed to them. Informed mother, that orders were electronically in epic, which [facility] can access, as that was who order was placed to when originally ordered as well.

## 2015-11-06 NOTE — ED Triage Notes (Signed)
Patient comes in today with stiffness and pain in her neck for a week. Patient states she went to her PCP and they schedule her for an MRI next week; however, two nights ago the pain worsened, and she presents to the ER today. She states the pain is in the left side of her neck when she moves her neck, arms, or takes a deep breath. Patient has taken Advil for the pain without relief. Patient denies any injury or trauma to her neck and doesn't remember what she was doing when it started. Hx of 2 vertebral dissections - last one in 1997. No other complaints. Denies any PMH. With movement patient states the pain is an 8 on a scale of 0-10.

## 2015-11-06 NOTE — Discharge Instructions (Signed)
Please read and follow all provided instructions.  Your diagnoses today include:  1. Neck pain on left side   2. Vertebral artery dissection (HCC)     Tests performed today include:  Vital signs - see below for your results today  MRI/angio of brain and neck - no dissections or aneurysms identified  Medications prescribed:   Vicodin (hydrocodone/acetaminophen) - narcotic pain medication  DO NOT drive or perform any activities that require you to be awake and alert because this medicine can make you drowsy. BE VERY CAREFUL not to take multiple medicines containing Tylenol (also called acetaminophen). Doing so can lead to an overdose which can damage your liver and cause liver failure and possibly death.   Robaxin (methocarbamol) - muscle relaxer medication  DO NOT drive or perform any activities that require you to be awake and alert because this medicine can make you drowsy.   Take any prescribed medications only as directed.  Home care instructions:   Follow any educational materials contained in this packet  Please rest, use ice or heat on your back for the next several days  Do not lift, push, pull anything more than 10 pounds for the next week  Follow-up instructions: Please follow-up with your primary care provider in the next 3 days for further evaluation of your symptoms.   Return instructions:  SEEK IMMEDIATE MEDICAL ATTENTION IF YOU HAVE:  New numbness, tingling, weakness, or problem with the use of your arms or legs  Severe back pain not relieved with medications  Loss control of your bowels or bladder  Increasing pain in any areas of the body (such as chest or abdominal pain)  Shortness of breath, dizziness, or fainting.   Worsening nausea (feeling sick to your stomach), vomiting, fever, or sweats  Any other emergent concerns regarding your health   Additional Information:  Your vital signs today were: BP 124/93    Pulse 86    Temp 98 F (36.7 C)  (Oral)    Resp 16    Ht 5\' 6"  (1.676 m)    Wt 68.5 kg    LMP 10/26/2015    SpO2 100%    BMI 24.37 kg/m  If your blood pressure (BP) was elevated above 135/85 this visit, please have this repeated by your doctor within one month. --------------

## 2016-01-29 ENCOUNTER — Ambulatory Visit (INDEPENDENT_AMBULATORY_CARE_PROVIDER_SITE_OTHER): Payer: BLUE CROSS/BLUE SHIELD

## 2016-01-29 DIAGNOSIS — Z23 Encounter for immunization: Secondary | ICD-10-CM

## 2016-02-13 ENCOUNTER — Encounter: Payer: Self-pay | Admitting: Neurology

## 2016-02-13 ENCOUNTER — Ambulatory Visit (INDEPENDENT_AMBULATORY_CARE_PROVIDER_SITE_OTHER): Payer: BLUE CROSS/BLUE SHIELD | Admitting: Neurology

## 2016-02-13 VITALS — BP 124/84 | HR 72 | Ht 66.0 in | Wt 157.0 lb

## 2016-02-13 DIAGNOSIS — R413 Other amnesia: Secondary | ICD-10-CM | POA: Diagnosis not present

## 2016-02-13 DIAGNOSIS — R51 Headache: Secondary | ICD-10-CM | POA: Diagnosis not present

## 2016-02-13 DIAGNOSIS — G4486 Cervicogenic headache: Secondary | ICD-10-CM

## 2016-02-13 DIAGNOSIS — I7774 Dissection of vertebral artery: Secondary | ICD-10-CM
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
## 2016-02-13 NOTE — Patient Instructions (Signed)
We will order neurocognitive testing to evaluate your memory.  If the results show anything concerning, I will have you follow up with me to discuss further.  Otherwise, follow up as needed.

## 2016-03-29 ENCOUNTER — Ambulatory Visit (INDEPENDENT_AMBULATORY_CARE_PROVIDER_SITE_OTHER): Payer: BLUE CROSS/BLUE SHIELD | Admitting: Psychology

## 2016-03-29 ENCOUNTER — Encounter: Payer: BLUE CROSS/BLUE SHIELD | Admitting: Psychology

## 2016-03-29 DIAGNOSIS — R413 Other amnesia: Secondary | ICD-10-CM | POA: Diagnosis not present

## 2016-03-29 NOTE — Progress Notes (Signed)
NEUROPSYCHOLOGICAL INTERVIEW (CPT: [id number])  Name: Ruth Cooper Date of Birth: October 21, 1970 Date of Interview: 03/29/2016  Reason for Referral:  Ruth Cooper is a 46 y.o., right-handed female who is referred for neuropsychological evaluation by Dr. Metta Clines of [facility] due to concerns about memory loss. This patient is unaccompanied in the office for today's appointment.  History of Presenting Problem:  Ruth Cooper has a history of migraines and to vertebral artery dissections in her early 25s. Ruth Cooper saw Dr. Tomi Likens on 11/05/2015 for new onset of headache as well as episodes of brief stabbing sensation in her eye. MRI and MRA on 11/06/2015 were both negative. She followed up with Dr. Tomi Likens on 02/13/2016 and reported her headaches are now infrequent. She complained of memory deficits at that time, however. According to records reviewed, she scored 26/30 on the moca on 11/10/2015. Family history is reportedly significant for Alzheimer's disease in her paternal grandmother (symptoms began in her 71s and she passed away in her 65s).  Ruth Cooper reported that she first became concerned about her memory a couple of years ago. She reported that she was at a baseball practice for her son when he came and asked her for the car keys. She gave them to him and then moments later went into a panic because she could not find her car keys and had no memory of him asking her for them or her giving them to him. She reported that more recently she is concerned that she has difficulty recalling names of clients who call her at work. She noted that she has been with the same employer since 1990 and has known many of her clients for very long time. She reported that she frequently misplaces or loses her phone and keys. She frequently has memory lapses for things she has just done; for example, she recently could not find her grocery list anywhere and was looking all over for when she ended up finding  in her back pocket and had no memory of having just putt it there. Her children tell her that she has said things and she has no memory of having said them. Yesterday, while in the car with her family, she said something and new that she had just said something, but could not even recall what she had just said. She noted that at work she is having a harder time adjusting to changes in the computer system. She also reported takes her more time to recall codes that she has used for years. She reported word finding difficulty and semantic retrieval issues; she recently saw a picture of a fire hydrant on a television show and could not think of what it was called. She had task her children. She also reported that she sometimes substance use the wrong word when speaking. Ruth Cooper reported that in the past she was able to keep up with helping her children with their homework quite easily, but now it is much more difficult for her. Finally, the patient notices that she frequently loses concentration and her mind wanders while she is in conversation. She will catch herself saying "uh huh," and not even know what she is responding to. Upon direct questioning, the patient denied significant difficulties with navigation or driving. She did note that she sometimes misses turns because she is preoccupied, but this is nothing new.  The patient noted that a couple of years ago, after having some routine lab work done, it was discovered that her  sodium level was lower than normal. She was told to increase her sodium intake which she thought was peculiar since "my salt intake is through the roof, I salt everything". She was exposed to follow up with repeat testing but never did until recently. Her sodium levels were reportedly normal when she had her recent physical. The patient noted that her son saw something on television about a correlation between sodium intake and memory loss, and they wondered if this could explain any  of her cognitive difficulties.  Psychiatric history is essentially negative. The patient reported grief surrounding the loss of her coworker in 2009. She reported that he was her best friend. She has never been treated for mental health condition. She noted that when she was pregnant with her first child, her OB/GYN prescribed an antidepressant but she was never sure why and she never took the medication. She denied any history of suicidal ideation or intention.   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 She continues to manage all instrumental ADLs, including driving, management of family finances (denies any problems with this), appointments, cooking and laundry/housekeeping. She noted that she has forgotten a couple of conference calls for work. She also noted she has a lot of schedules to juggle including her son's baseball practice schedules. She noted that when cooking, she often cannot recall if she has just put in an ingredient. She also notices attention lapses and some distractibility while cleaning the house.  The patient denied any difficulty with sleep. She gets 7-8 hours of sleep at night. She does experience some fatigue during the day but attributes this to being very busy. She is no longer having eye pain, and her headaches are infrequent. She was prescribed gabapentin for headache but did not fill it because she saw one of the side effects could be memory loss, and she would rather have the headache than any more memory difficulties. She denied any problem with balance or walking. She has not had any falls.  With regard to mood, the patient described it as "okay". She denied any significant depression. She reported that typically she is happy but sometimes she does get frustrated and stressed with her children. She endorsed increased stress and anxiety, particularly at work. She reported  that due to heightened regulations in the past year, she is responsible for a lot more documenting. She admits that she frequently thinks about work throughout the evening once she is home. She notes that her job has always been stressful due to working with client accounts with large sums of money, and now that she is having more issues with memory and attention, she is particularly anxious about making any errors.  With regards appetite and diet, the patient reported that she has "horrible eating habits". She frequently eats fast food and junk food. She has gained 10 pounds in the past year. She has never participated in regular physical exercise.   Social History: Born/Raised: [place] Education: [employer] (bachelor's in Engineer, mining) Occupational history: Finance Marital history: Married, 3 children (about to be 65, 81, 4 yo) Alcohol/Tobacco/Substances: No alcohol, never a smoker, no SA.   Medical History: Past Medical History:  Diagnosis Date  . Frequent headaches   . 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    Current Medications:  No outpatient encounter prescriptions on file as of 03/29/2016.  No facility-administered encounter medications on file as of 03/29/2016.      Behavioral Observations:   Appearance: Neatly and appropriately dressed and groomed Gait: Ambulated independently, no abnormalities observed Speech: Fluent; normal rate, rhythm and volume Thought process: Generally linear Affect: Full, anxious Interpersonal: Very pleasant, appropriate   TESTING: There is medical necessity to proceed with neuropsychological assessment as the results will be used to aid in differential diagnosis and clinical decision-making and to inform specific treatment recommendations. Per the patient and medical records reviewed, there has been a change in cognitive functioning and a reasonable suspicion of a cognitive disorder.   PLAN: The  patient will return for a full battery of neuropsychological testing with a psychometrician under my supervision. Education regarding testing procedures was provided. Subsequently, the patient will see this provider for a follow-up session at which time her test performances and my impressions and treatment recommendations will be reviewed in detail.   Full neuropsychological evaluation report to follow.

## 2016-03-30 ENCOUNTER — Encounter: Payer: Self-pay | Admitting: Psychology

## 2016-04-06 ENCOUNTER — Ambulatory Visit (INDEPENDENT_AMBULATORY_CARE_PROVIDER_SITE_OTHER): Payer: BLUE CROSS/BLUE SHIELD | Admitting: Psychology

## 2016-04-06 DIAGNOSIS — R413 Other amnesia: Secondary | ICD-10-CM

## 2016-04-06 NOTE — Progress Notes (Signed)
   Neuropsychology Note  Ruth Cooper returned today for 3 hours of neuropsychological testing with technician, Milana Kidney, BS, under the supervision of Dr. Macarthur Critchley. The patient did not appear overtly distressed by the testing session, per behavioral observation or via self-report to the technician. Rest breaks were offered. Ruth Cooper will return within 2 weeks for a feedback session with Dr. Si Raider at which time her test performances, clinical impressions and treatment recommendations will be reviewed in detail. The patient understands she can contact our office should she require our assistance before this time.  Full report to follow.

## 2016-04-20 NOTE — Progress Notes (Signed)
NEUROPSYCHOLOGICAL EVALUATION   Name:    Ruth Cooper  Date of Birth:   1971/01/10 Date of Interview:  03/29/2016 Date of Testing:  04/06/2016   Date of Feedback:  04/22/2016      Background Information:  Reason for Referral:  Ruth Cooper is a 46 y.o., right-handed female referred by Dr. Metta Clines to assess her current level of cognitive functioning and assist in differential diagnosis. Ruth current evaluation consisted of a review of available medical records, an interview with Ruth Cooper, and Ruth completion of a neuropsychological testing battery. Informed consent was obtained.  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 Ruth Cooper saw Dr. Tomi Likens on 11/05/2015 for new onset of headache as well as episodes of brief stabbing sensation in her eye. MRI and MRA on 11/06/2015 were both negative. She followed up with Dr. Tomi Likens on 02/13/2016 and reported her headaches are now infrequent. She complained of memory deficits at that time, however. According to records reviewed, she scored 26/30 on Ruth MoCA on 11/10/2015. Family history is reportedly significant for Alzheimer's disease in her paternal grandmother (symptoms began in her 74s and she passed away in her 27s).  Ruth Cooper reported that she first became concerned about her memory a couple of years ago. She reported that she was at a baseball practice for her son when he came and asked her for Ruth car keys. She gave them to him and then moments later went into a panic because she could not find her car keys and had no memory of him asking her for them or her giving them to him. She reported that more recently she is concerned that she has difficulty recalling names of clients who call her at work. She noted that she has been with Ruth same employer since 1990 and has known many of her clients for very long time. She reported that she frequently misplaces  or loses her phone and keys. She frequently has memory lapses for things she has just done; for example, she recently could not find her grocery list anywhere and was looking all over for when she ended up finding in her back pocket and had no memory of having just putt it there. Her children tell her that she has said things and she has no memory of having said them. Yesterday, while in Ruth car with her family, she said something and new that she had just said something, but could not even recall what she had just said. She noted that at work she is having a harder time adjusting to changes in Ruth computer system. She also reported takes her more time to recall codes that she has used for years. She reported word finding difficulty and semantic retrieval issues; she recently saw a picture of a fire hydrant on a television show and could not think of what it was called. She had task her children. She also reported that she sometimes substance use Ruth wrong word when speaking. Ruth Cooper reported that in Ruth past she was able to keep up with helping her children with their homework quite easily, but now it is much more difficult for her. Finally, Ruth Cooper notices that she frequently loses concentration and her mind wanders while she is in conversation. She will catch herself saying "uh huh," and not even know what she is responding to. Upon direct questioning, Ruth Cooper denied significant difficulties  with navigation or driving. She did note that she sometimes misses turns because she is preoccupied, but this is nothing new.  Ruth Cooper noted that a couple of years ago, after having some routine lab work done, it was discovered that her sodium level was lower than normal. She was told to increase her sodium intake which she thought was peculiar since "my salt intake is through Ruth roof, I salt everything". She was supposed to follow up with repeat testing but never did until recently. Her sodium levels  were reportedly normal when she had her recent physical. Ruth Cooper noted that her son saw something on television about a correlation between sodium intake and memory loss, and they wondered if this could explain any of her cognitive difficulties.  Psychiatric history is essentially negative. Ruth Cooper reported grief surrounding Ruth loss of her coworker in 2009. She reported that he was her best friend. She has never been treated for mental health condition. She noted that when she was pregnant with her first child, her OB/GYN prescribed an antidepressant but she was never sure why and she never took Ruth medication. She denied any history of suicidal ideation or intention.   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 She continues to manage all instrumental ADLs, including driving, management of family finances (denies any problems with this), appointments, cooking and laundry/housekeeping. She noted that she has forgotten a couple of conference calls for work. She also noted she has a lot of schedules to juggle including her son's baseball practice schedules. She noted that when cooking, she often cannot recall if she has just put in an ingredient. She also notices attention lapses and some distractibility while cleaning Ruth house.  Ruth Cooper denied any difficulty with sleep. She gets 7-8 hours of sleep at night. She does experience some fatigue during Ruth day but attributes this to being very busy. She is no longer having eye pain, and her headaches are infrequent. She was prescribed gabapentin for headache but did not fill it because she saw one of Ruth side effects could be memory loss, and she would rather have Ruth headache than any more memory difficulties. She denied any problem with balance or walking. She has not had any falls.  With regard to mood, Ruth Cooper described it as "okay". She  denied any significant depression. She reported that typically she is happy but sometimes she does get frustrated and stressed with her children. She endorsed increased stress and anxiety, particularly at work. She reported that due to heightened regulations in Ruth past year, she is responsible for a lot more documenting. She admits that she frequently thinks about work throughout Ruth evening once she is home. She notes that her job has always been stressful due to working with client accounts with large sums of money, and now that she is having more issues with memory and attention, she is particularly anxious about making any errors.  With regards appetite and diet, Ruth Cooper reported that she has "horrible eating habits". She frequently eats fast food and junk food. She has gained 10 pounds in Ruth past year. She has never participated in regular physical exercise.   Social History: Born/Raised: [place] Education: [employer] (bachelor's in Engineer, mining) Occupational history: Finance Marital history: Married, 3 children (about to be 75, 92, 75 yo) Alcohol/Tobacco/Substances: No alcohol, never a smoker, no SA.   Medical History:  Past  Medical History:  Diagnosis Date  . Frequent headaches   . 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    Current medications:  No outpatient encounter prescriptions on file as of 04/22/2016.   No facility-administered encounter medications on file as of 04/22/2016.     Current Examination:  Behavioral Observations:  Appearance: Neatly and appropriately dressed and groomed Gait: Ambulated independently, no abnormalities observed Speech: Fluent; normal rate, rhythm and volume Thought process: Generally linear Affect: Full, anxious Interpersonal: Very pleasant, appropriate Orientation: Oriented to all spheres. Accurately named Ruth current President and his predecessor.  Tests Administered: . Test of Premorbid  Functioning (TOPF) . Wechsler Adult Intelligence Scale-Fourth Edition (WAIS-IV): Similarities, Block Design, Matrix Reasoning, Arithmetic, Symbol Search, Coding and Digit Span subtests . Wechsler Memory Scale-Fourth Edition (WMS-IV) Adult Version (ages 93-69): Logical Memory I, II and Recognition subtests  . [place] Verbal Learning Test - 2nd Edition (CVLT-2) Standard Form . Conners Continuous Performance Test 3rd Edition (CPT3) . [employer] (WCST) . Repeatable Battery for Ruth Assessment of Neuropsychological Status (RBANS) Form A:  Figure Copy and Figure Recall subtest and Semantic Fluency subtest . Neuropsychological Assessment Battery (NAB) Language Module, Form 1:  Naming subtest . Controlled Oral Word Association Test (COWAT) . Trail Making Test A and B . Boston Diagnostic Aphasia Examination (BDAE): Complex Ideational Material Subtest . Beck Depression Inventory - Second edition (BDI-II) . Generalized Anxiety Disorder - 7 item screener (GAD-7) . Personality Assessment Inventory (PAI)  Test Results: Note: Standardized scores are presented only for use by appropriately trained professionals and to allow for any future test-retest comparison. These scores should not be interpreted without consideration of all Ruth information that is contained in Ruth rest of Ruth report. Ruth most recent standardization samples from Ruth test publisher or other sources were used whenever possible to derive standard scores; scores were corrected for age, gender, ethnicity and education when available.   Test Scores:  Test Name Raw Score Standardized Score Descriptor  TOPF 49/70 SS= 106 Average  WAIS-IV Subtests     Similarities 28/36 ss= 11 Average  Block Design 49/66 ss= 12 High average  Matrix Reasoning 19/26 ss= 11 Average  Arithmetic 13/22 ss= 9 Average  Symbol Search 40/60 ss= 14 Superior  Coding 77/135 ss= 12 High average  Digit Span 29/48 ss= 11 Average  WAIS-IV Index Scores       Working Memory  SS= 100 Average  Processing Speed  SS= 117 High average  WMS-IV Subtests     LM I 24/50 ss= 10 Average  LM II 23/50 ss= 11 Average  LM II Recognition 26/30 Cum %: >75 Above average  CVLT-II Scores     Trial 1 5/16 Z= -1 Low average  Trial 5 15/16 Z= 1 High average  Trials 1-5 total 47/80 T= 47 Average  SD Free Recall 9/16 Z= -0.5 Average  SD Cued Recall 12/16 Z= 0 Average  LD Free Recall 11/16 Z= 0 Average  LD Cued Recall 11/16 Z= -0.5 Average  Recognition Discriminability 14/16 hits, 0 false positives Z= 0.5 Average  Forced Choice Recognition 16/16  WNL  CPT3     Detectability  T= 49 Average  Omissions  T= 48 Average  Commissions  T= 45 Average  Perseverations  T= 46 Average  HRT ISI Change  T= 63 Elevated  WCST     Total Errors 9 T= 55 Average  Perseverative Responses 4 T= 48 Average  Perseverative Errors 4 T= 49 Average  Conceptual Level Responses 51 T= 48 Average  Categories Completed 5 >16% WNL  Trials to Complete 1st Category 11 >16% WNL  Failure to Maintain Set 0  WNL  RBANS Subtest     Figure Copy 20/20 Z= 1.2 High average  Figure Recall 10/20 Z= -1.1 Low average  Semantic Fluency 21 Z= 0 Average  NAB Language Naming 31/31 T= 53 Average  COWAT-FAS 35 T= 41 Low average  COWAT-Animals 22 T= 50 Average  Trail Making Test A  14" 0 errors T= 69 Superior  Trail Making Test B  39" 0 errors T= 64 Superior  BDAE Subtest     Complex Ideational Material 10/12  Below expectation  BDI-II 5/63  WNL  GAD-7 6/21  Mild  PAI No elevated clinical scales to report       Description of Test Results:  Embedded performance validity indicators were within normal limits. As such, Ruth Cooper's current performance on neurocognitive testing is judged to be a relatively accurate representation of her current level of neurocognitive functioning.   Premorbid verbal intellectual abilities were estimated to have been within Ruth average range based on a test of word  reading. Psychomotor processing speed was high average. Auditory attention and working memory were average. On a test of sustained visual attention, Ruth Cooper's overall performance was within normal limits and not indicative of an attentional disorder. She did not demonstrate any signs of inattentiveness, impulsivity, or sustained attention, but she did demonstrate possible reduction in vigilance (ie, some problems with performance on trials with longer intervals between stimuli). Visual-spatial construction was high average. Language abilities were within normal limits. Specifically, confrontation naming was average (with 100% accuracy), and semantic verbal fluency was average. Auditory comprehension of complex ideational material was slightly below expectation. With regard to verbal memory, encoding and acquisition of non-contextual information (i.e., word list) was average. After an interference task, free recall was average. With semantic cueing, she recalled an additional 3 items (average). After a delay, free recall was average. Cued recall was average. Performance on a yes/no recognition task was average. On another verbal memory test, encoding and acquisition of contextual auditory information (i.e., short stories) was average. After a delay, free recall was average. Performance on yes/no recognition tasks was above average. With regard to non-verbal memory, delayed free recall of visual information was low average. Executive functioning was intact overall. Mental flexibility and set-shifting were superior on Trails B. Verbal fluency with phonemic search restrictions was low average. Verbal abstract reasoning was average. Non-verbal abstract reasoning was high average. Deductive reasoning and problem solving was average.   On a self-report measure of mood, Ruth Cooper's responses were not indicative of clinically significant depression at Ruth present time. On a self-report measure of anxiety, she did  endorse mild generalized anxiety characterized by nervousness, inability to control worrying, worrying too much about different things an difficulty relaxing.   On a more extensive measure of psychopathology and personality (PAI), there were some abnormalities on symptom validity indicators. Specifically, Ruth client's pattern of responses suggests that she tends to present herself in a consistently favorable light, and as being relatively free of common shortcomings to which most individuals will admit.  She appears reluctant to admit to minor faults, and she may minimize problems or other areas where functioning might be less than optimal.  Given this apparent defensive tendency, Ruth PAI results should be reviewed with caution.  Ruth clinical profile may underrepresent Ruth extent  and degree of any significant findings in particular areas due to Ruth client's reluctance to acknowledge personal problems or limitations.  Ruth PAI clinical profile reveals no elevations that should be considered to indicate Ruth presence of clinical psychopathology.  Some denial or defensiveness is likely to be responsible for Ruth generally trouble-free picture that she is reporting, as she seems to be reluctant to admit to dysfunction or problems across many areas.  Despite Ruth level of defensiveness noted above, there are some areas where Ruth client described problems of greater intensity than is typical of defensive patients.  These areas could indicate problems that merit further inquiry.  These areas include: disruptions in thought process; compulsiveness or rigidity; physical signs of anxiety; tension and apprehension; frequent routine physical complaints; and physical signs of depression. According to Ruth Cooper's self-report, she describes NO significant problems in Ruth following areas: unusual thoughts or peculiar experiences; antisocial behavior; problems with empathy; undue suspiciousness or hostility; extreme moodiness  and impulsivity; unhappiness and depression; unusually elevated mood or heightened activity; marked anxiety; problematic behaviors used to manage anxiety; difficulties with health or physical functioning.  Also, she reports NO significant problems with alcohol or drug abuse or dependence.  With respect to suicidal ideation, Ruth Cooper is NOT reporting distress from thoughts of self-harm.   Clinical Impressions: Generalized anxiety disorder. No evidence of a cognitive disorder or developing dementia at this time. Results of cognitive testing were entirely within normal limits and not indicative of a cognitive disorder or underlying dementia at this time. There are indications of mild to moderate generalized anxiety, and I suspect this is what is contributing to Ruth Cooper's subjective cognitive complaints. Psychosocial stress related to her job may also be playing a role. It is my opinion that better management of stress/anxiety would likely enhance cognitive functioning in daily life.    Recommendations/Plan: Based on Ruth findings of Ruth present evaluation, Ruth following recommendations are offered:  1. Stress management: Ruth impact of stress and anxiety on cognitive functioning was described to Ruth Cooper. She was encouraged to begin engaging in stress management practices, including mindfulness meditation, which could also enhance cognitive functioning in daily life. 2. Behavioral strategies to enhance attention and memory encoding were also reviewed with her. 3. Ruth Cooper has expressed concern that high levels of sodium intake may cause memory loss. Ruth research on this topic is sparse and rather inconclusive. There is some evidence to suggest that a low sodium diet actually correlates with increased cognitive decline in adults over age 22. There was a recent study with laboratory mice suggesting that high levels of sodium intake could cause memory impairments that are reversible. If she is  concerned about Ruth impact of high sodium intake on memory, she could certainly try reducing her sodium intake and see if she notices any difference. 4. If Ruth Cooper has cognitive concerns in Ruth future or notices worsening of symptoms despite using strategies we discussed, these test results can serve as a baseline for comparison on re-testing.   Feedback to Cooper: Ruth Cooper and her mother returned for a feedback appointment on 04/22/2016 to review Ruth results of her neuropsychological evaluation with this provider. 45 minutes face-to-face time was spent reviewing her test results, my impressions and my recommendations as detailed above.    Total time spent on this Cooper's case: [id number] unit for interview with psychologist; [phone] units of testing by psychometrician under psychologist's supervision; [phone] units for medical record review, scoring of neuropsychological tests, interpretation  of test results, preparation of this report, and review of results to Ruth Cooper by psychologist.      Thank you for your referral of RASHEED GIVLER. Please feel free to contact me if you have any questions or concerns regarding this report.

## 2016-04-22 ENCOUNTER — Ambulatory Visit (INDEPENDENT_AMBULATORY_CARE_PROVIDER_SITE_OTHER): Payer: BLUE CROSS/BLUE SHIELD | Admitting: Psychology

## 2016-04-22 ENCOUNTER — Encounter: Payer: Self-pay | Admitting: Psychology

## 2016-04-22 DIAGNOSIS — R413 Other amnesia: Secondary | ICD-10-CM | POA: Diagnosis not present

## 2016-04-22 NOTE — Patient Instructions (Signed)
Fortunately, results of cognitive testing were entirely within normal limits and NOT indicative of a neurocognitive disorder or developing dementia.   Your cognitive symptoms in daily life are very likely due to anxiety/stress.  Below is some more information on this. I am hopeful that improved stress management can result in improved cognitive functioning in your daily life.    The effect of stress and anxiety on your cognitive functioning: . In times of stress, it is common to have difficulty concentrating, paying attention and making decisions . Various anxiety disorders also interfere with attention and concentration . Problems with attention and concentration can disrupt the process of learning and making new memories, which can make it seem like there is a problem with your memory. In your daily life, you may experience this disruption as forgetting names and appointments, misplacing items, and needing to make lists for shopping and errands. It may be harder for you to stay focused on tasks and feel as "sharp" as you did in the past.  . Also, when we are stressed or anxious, we often pay more attention to our difficulties (rather than our strengths) in our daily life, and this can make it seem to [place] like we are doing worse cognitively than we really are. . The cognitive aspects of stress and anxiety are sometimes observed as an identifiable pattern of poor performance on a neuropsychological evaluation, but it is also possible that all scores on an evaluation are within normal limits. . Regardless of the test scores, stress and anxiety can interfere with the ability to make use of your cognitive resources and function optimally across settings such as work or school, maintaining the home and responsibilities, and personal relationships. . Fortunately, there are ways to manage stress and anxiety, and when mood improves, cognitive functioning in daily life often improves. . Treatment options  include psychotherapy, medications (e.g., antidepressants), and behavioral changes, such as engaging in mindfulness meditation, increasing your involvement in enjoyable activities, increasing the amount of exercise you are getting, and maintaining a regular routine. Please see attached information.   Strategies to enhance cognitive functioning Attention, concentration, memory encoding and consolidation    . Make a plan and be prepared o If you find that you are more attentive at certain times of the day (i.e., the morning), plan important activities and discussions at that time o Determine which activities take the most time and which are most important, then prioritize your "to do list" based on this information o Break tasks into simpler parts, understand the steps involved before starting o Rehearse the steps mentally or write them down. If you write them down, you can use this as a checklist to check off as you complete them. o Visualize completing the task  . Use external aids  o Write everything down that you do not need to know or work with right now. Don't store extra information in your brain that you don't need right now.  o Use a calendar or planner to make checklists, due dates and "to do" lists. o Use ONLY ONE calendar or planner and look at it frequently o Set alarms for important deadlines or appointments  . Minimize interruptions and distractions  o Find a good work environment, e.g., quiet room with a desk, close curtains, use earplugs, mask sounds with a fan or white noise machine o Turn off cell phone and/or email alerts during important tasks. In fact, it is helpful to schedule a block of time each day where  you limit phone and email interruptions and focus on just the more detailed work you have. o Try to minimize the amount of background noise (i.e., television, music) when engaged in important tasks or conversations with others (note that some individuals find soft  background music helpful in minimize distraction, so you may need to experiment with optimal level of noise for specific situations)  . Use active effort = consciously attending to details, closely analyzing o Failures of encoding may reflect failure to attend to one's own actions o Be prepared to work more slowly than you usually do  o When reading, allow time for re-reading sections  o Check your work for errors  . Avoid multitasking o Do not attempt to complete more than one task at one time. Focus on one task until it is completed and then move on to the next one. o Avoid other activities while engaged in important tasks, such as talking on the phone while balancing the checkbook, making a shopping list during a meeting.   . Use self-talk during tasks o Repeat the steps of the activity to yourself as you complete them o Talk to yourself about your progress o This helps you maintain focus on the task and makes it easier to remember completing the task (Similar to "active effort" above)  . Conserve energy o Conserve energy to avoid fatigue and its effects on cognition - Get enough sleep - Pace yourself  and make sure to take breaks - Be open to receiving help - Exercise for increased energy  . Conversational vigilance = paying attention during a conversation  o Listen actively: focus on the speaker and position yourself so that you can clearly hear the him/her, and have open/relaxed posture  o Eye contact: Maintaining eye contact with the person you are speaking with may increase the likelihood that important information is properly received  o Ask questions: Ask questions for clarification (e.g., request that the speaker explain something in a different way) or ask for information to be repeated if you become distracted, or if you do not hear or understand something during a conversation  o Paraphrase: Summarize or repeat back important information from a conversation in your own  words to facilitate communication and ensure that you have heard correctly and understand

## 2016-10-04 DIAGNOSIS — L570 Actinic keratosis: Secondary | ICD-10-CM | POA: Diagnosis not present

## 2016-10-04 DIAGNOSIS — L821 Other seborrheic keratosis: Secondary | ICD-10-CM | POA: Diagnosis not present

## 2016-10-04 DIAGNOSIS — D229 Melanocytic nevi, unspecified: Secondary | ICD-10-CM | POA: Diagnosis not present

## 2016-10-04 DIAGNOSIS — L814 Other melanin hyperpigmentation: Secondary | ICD-10-CM | POA: Diagnosis not present

## 2017-07-20 ENCOUNTER — Encounter: Payer: Self-pay | Admitting: General Practice

## 2017-11-14 DIAGNOSIS — M542 Cervicalgia: Secondary | ICD-10-CM | POA: Diagnosis not present

## 2017-11-15 ENCOUNTER — Telehealth: Payer: Self-pay | Admitting: Emergency Medicine

## 2017-11-15 ENCOUNTER — Ambulatory Visit: Payer: BLUE CROSS/BLUE SHIELD | Admitting: Internal Medicine

## 2017-11-15 ENCOUNTER — Encounter: Payer: Self-pay | Admitting: Internal Medicine

## 2017-11-15 VITALS — BP 126/62 | HR 91 | Temp 98.1°F | Resp 16 | Ht 66.5 in | Wt 161.0 lb

## 2017-11-15 DIAGNOSIS — J40 Bronchitis, not specified as acute or chronic: Secondary | ICD-10-CM | POA: Diagnosis not present

## 2017-11-15 DIAGNOSIS — M549 Dorsalgia, unspecified: Secondary | ICD-10-CM | POA: Diagnosis not present

## 2017-11-15 MED ORDER — HYDROCODONE-HOMATROPINE 5-1.5 MG/5ML PO SYRP: 5.0000 mL | ORAL_SOLUTION | Freq: Every evening | ORAL | 0 refills | Status: DC | PRN

## 2017-11-15 MED ORDER — AZITHROMYCIN 250 MG PO TABS: ORAL_TABLET | ORAL | 0 refills | Status: DC

## 2017-11-15 NOTE — Progress Notes (Signed)
Pre visit review using our clinic review tool, if applicable. No additional management support is needed unless otherwise documented below in the visit note. 

## 2017-11-15 NOTE — Telephone Encounter (Signed)
Copied from [place] [id number]. Topic: Appointment Scheduling - Scheduling Inquiry for Clinic >> Nov 15, 2017  7:52 AM Conception Chancy, NT wrote: Reason for CRM: Danne Baxter the patients mother is a patient of Dr. Lorelei Pont and states she spoke with Dr. Lorelei Pont and she said she would take Ms. Ruth Cooper on as a new patient. Patient would like to switch primary care with her. Please contact to confirm. [place] to transfer?

## 2017-11-15 NOTE — Progress Notes (Signed)
Subjective:    Patient ID: Ruth Cooper, female    DOB: 30-Aug-1970, 47 y.o.   MRN: [id number]  DOS:  11/15/2017 Type of visit - description : Acute visit, here with her mother Interval history: His main concern today is upper back pain. The pain started 10 days ago, is located at the left trapezoid area, no radiation.  No neck pain. Certain positions make the pain better such as lying down on the left side or raising her arms. Went to see orthopedic doctor yesterday, got 2 IM shots, was prescribed Robaxin and prednisone.  She has also been taking Advil and applying a "sports cream" locally.  She also developed a URI approximately 2 weeks ago and although she feels somewhat better, she have a steady cough. The cough was moderate to severe at some point and she feels that she might have sprained her upper back/ neck that way. Otherwise denies any injury.   Review of Systems Not on birth control, LMP 3 weeks ago. No fever chills or rash No upper or lower extremity paresthesias.  No neck pain per se. No headache, nausea, vomiting or dizziness. No diplopia or motor deficits.  Past Medical History:  Diagnosis Date  . Frequent headaches   . 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    Past Surgical History:  Procedure Laterality Date  . TONSILLECTOMY  1997  . WISDOM TOOTH EXTRACTION      Social History   Socioeconomic History  . Marital status: Married    Spouse name: Not on file  . Number of children: 3  . Years of education: Not on file  . Highest education level: Not on file  Occupational History  . Occupation: office work  Scientific laboratory technician  . Financial resource strain: Not on file  . Food insecurity:    Worry: Not on file    Inability: Not on file  . Transportation needs:    Medical: Not on file    Non-medical: Not on file  Tobacco Use  . Smoking status: Never Smoker  . Smokeless tobacco: Never Used  Substance and  Sexual Activity  . Alcohol use: No  . Drug use: No  . Sexual activity: Not Currently  Lifestyle  . Physical activity:    Days per week: Not on file    Minutes per session: Not on file  . Stress: Not on file  Relationships  . Social connections:    Talks on phone: Not on file    Gets together: Not on file    Attends religious service: Not on file    Active member of club or organization: Not on file    Attends meetings of clubs or organizations: Not on file    Relationship status: Not on file  . Intimate partner violence:    Fear of current or ex partner: Not on file    Emotionally abused: Not on file    Physically abused: Not on file    Forced sexual activity: Not on file  Other Topics Concern  . Not on file  Social History Narrative   Patient works as Contractor.        Patient currently married with 3 children, all healthy.       -- 15, 12, 9      Lives in a 2 story home.        Education: college  Allergies as of 11/15/2017   No Known Allergies     Medication List        Accurate as of 11/15/17 11:59 PM. Always use your most recent med list.          azithromycin 250 MG tablet Commonly known as:  ZITHROMAX 2 tabs a day the first day, then 1 tab a day x 4 days   HYDROcodone-homatropine 5-1.5 MG/5ML syrup Commonly known as:  HYCODAN Take 5 mLs by mouth at bedtime as needed for cough.   methocarbamol 500 MG tablet Commonly known as:  ROBAXIN Take 1 tablet by mouth 2 (two) times daily as needed.   methylPREDNISolone 4 MG Tbpk tablet Commonly known as:  MEDROL DOSEPAK As directed          Objective:   Physical Exam  Musculoskeletal:       Arms:  BP 126/62 (BP Location: Left Arm, Patient Position: Sitting, Cuff Size: Small)   Pulse 91   Temp 98.1 F (36.7 C) (Oral)   Resp 16   Ht 5' 6.5" (1.689 m)   Wt 161 lb (73 kg)   LMP 10/24/2017 (Approximate)   SpO2 98%   BMI 25.60 kg/m  General:   Well developed, NAD, see BMI.   Frequent cough noted. HEENT:  Normocephalic . Face symmetric, atraumatic Neck: Range of motion is normal, no TTP of the cervical spine. Lungs:  CTA B Normal respiratory effort, no intercostal retractions, no accessory muscle use. Heart: RRR,  no murmur.  No pretibial edema bilaterally  Skin: Not pale. Not jaundice MSK: Shoulder symmetric, range of motion normal without problems. Neurologic:  alert & oriented X3.  Speech normal, gait appropriate for age and unassisted.  DTR symmetric, motor symmetric, EOMI.  Pupils equal and reactive. Psych--  Cognition and judgment appear intact.  Cooperative with normal attention span and concentration.  Behavior appropriate. No anxious or depressed appearing.      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 Neuro exam is normal.  Went to see a orthopedic doctor yesterday, was prescribed Robaxin and prednisone. Most likely this is a MSK issue, doubt radiculopathy but that is in the differential. Recommend to proceed with methocarbamol, prednisone.  Also judicious use of Tylenol and ibuprofen, GI precautions discussed. Heating pad. Call if not better. Bronchitis: Cough for 2 weeks, bronchitis?.    Recommend a Z-Pak and  cough control with hydrocodone.  Drowsiness discussed. History of vertebral artery dissections: No symptoms to suggest such a problem at this point. Recommend to see PCP next week for a checkup

## 2017-11-15 NOTE — Patient Instructions (Signed)
Recommend to proceed with prednisone and methocarbamol as prescribed  For pain, you can alternate Tylenol and ibuprofen, see below -Tylenol  500 mg OTC 2 tabs a day every 8 hours as needed for pain -IBUPROFEN (Advil or Motrin) 200 mg 2 tablets every 8 hours as needed for pain.  Always take it with food because may cause gastritis and ulcers.  If you notice nausea, stomach pain, change in the color of stools --->  Stop the medicine and let us know  Heating pad to the area  Follow-up your primary doctor next week  If you get worse, please go to the ER  Cough control: Mucinex DM twice a day as needed

## 2017-11-15 NOTE — Telephone Encounter (Signed)
Ok with me 

## 2017-11-16 ENCOUNTER — Encounter: Payer: Self-pay | Admitting: Family Medicine

## 2017-11-16 ENCOUNTER — Ambulatory Visit: Payer: BLUE CROSS/BLUE SHIELD | Admitting: Family Medicine

## 2017-11-16 ENCOUNTER — Ambulatory Visit (HOSPITAL_BASED_OUTPATIENT_CLINIC_OR_DEPARTMENT_OTHER)
Admission: RE | Admit: 2017-11-16 | Discharge: 2017-11-16 | Disposition: A | Discharge status: Home / Self Care | Payer: BLUE CROSS/BLUE SHIELD | Source: Ambulatory Visit | Attending: Family Medicine | Admitting: Family Medicine

## 2017-11-16 VITALS — BP 138/88 | HR 82 | Temp 98.1°F | Resp 16 | Ht 66.5 in | Wt 162.0 lb

## 2017-11-16 DIAGNOSIS — R059 Cough, unspecified: Secondary | ICD-10-CM

## 2017-11-16 DIAGNOSIS — Z130 Encounter for screening for diseases of the blood and blood-forming organs and certain disorders involving the immune mechanism: Secondary | ICD-10-CM

## 2017-11-16 DIAGNOSIS — R05 Cough: Secondary | ICD-10-CM | POA: Diagnosis not present

## 2017-11-16 DIAGNOSIS — Z13220 Encounter for screening for lipoid disorders: Secondary | ICD-10-CM

## 2017-11-16 DIAGNOSIS — Z131 Encounter for screening for diabetes mellitus: Secondary | ICD-10-CM | POA: Diagnosis not present

## 2017-11-16 DIAGNOSIS — M546 Pain in thoracic spine: Secondary | ICD-10-CM

## 2017-11-16 LAB — COMPREHENSIVE METABOLIC PANEL
ALT: 9 U/L (ref 0–35)
AST: 14 U/L (ref 0–37)
Albumin: 4.6 g/dL (ref 3.5–5.2)
Alkaline Phosphatase: 73 U/L (ref 39–117)
BUN: 16 mg/dL (ref 6–23)
CO2: 29 mEq/L (ref 19–32)
Calcium: 9.6 mg/dL (ref 8.4–10.5)
Chloride: 102 mEq/L (ref 96–112)
Creatinine, Ser: 0.59 mg/dL (ref 0.40–1.20)
GFR: 116.03 mL/min (ref >60.00)
Glucose, Bld: 94 mg/dL (ref 70–99)
Potassium: 4.4 mEq/L (ref 3.5–5.1)
Sodium: 136 mEq/L (ref 135–145)
Total Bilirubin: 0.3 mg/dL (ref 0.2–1.2)
Total Protein: 7.4 g/dL (ref 6.0–8.3)

## 2017-11-16 LAB — CBC
HCT: 36.9 % (ref 36.0–46.0)
Hemoglobin: 12.1 g/dL (ref 12.0–15.0)
MCHC: 32.8 g/dL (ref 30.0–36.0)
MCV: 79.5 fl (ref 78.0–100.0)
Platelets: 243 10*3/uL (ref 150.0–400.0)
RBC: 4.64 Mil/uL (ref 3.87–5.11)
RDW: 16.4 % — ABNORMAL HIGH (ref 11.5–15.5)
WBC: 9.8 10*3/uL (ref 4.0–10.5)

## 2017-11-16 LAB — LIPID PANEL
Cholesterol: 175 mg/dL (ref 0–200)
HDL: 59.2 mg/dL (ref >39.00)
LDL Cholesterol: 103 mg/dL — ABNORMAL HIGH (ref 0–99)
NonHDL: 115.81
Total CHOL/HDL Ratio: 3
Triglycerides: 65 mg/dL (ref 0.0–149.0)
VLDL: 13 mg/dL (ref 0.0–40.0)

## 2017-11-16 LAB — HEMOGLOBIN A1C: Hgb A1c MFr Bld: 5.6 % (ref 4.6–6.5)

## 2017-11-16 NOTE — Progress Notes (Addendum)
[place] [facility] [address], [place], [place] [address] [phone]  Date:  11/16/2017   Name:  Ruth Cooper   DOB:  Jul 25, 1970   MRN:  [id number]  PCP:  Brunetta Jeans, PA-C    Chief Complaint: Back Pain (left side back pain into neck, 2 weeks, no known injury, follow up from Dr. Larose Kells)   History of Present Illness:  Ruth Cooper is a 47 y.o. very pleasant female patient who presents with the following:  Pt who I have not seen in the past- was pt of Elyn Aquas, last seen by him in 2017.  She is here today with pain over her left shoulder blade and into her neck for about 2 weeks.   She has been in to see Dr. Percell Miller  at [facility] 2 days ago and got some sort of shot and po prednisone.  Also po muscle relaxer She was in and saw Dr. Larose Kells yesterday, per his note-  His main concern today is upper back pain. The pain started 10 days ago, is located at the left trapezoid area, no radiation.  No neck pain. Certain positions make the pain better such as lying down on the left side or raising her arms. Went to see orthopedic doctor yesterday, got 2 IM shots, was prescribed Robaxin and prednisone.  She has also been taking Advil on a sports cream locally. She also developed a URI approximately 2 weeks ago and although she feels somewhat better, she have a steady cough. The cough was moderate to severe at some point and she feels that she might have sprained her neck that way. Otherwise denies any injury.////////////////////// 47 year old female, PMH includes migraines and history of two vertebral artery dissections (traumatic per pt) in her early 39s,   headaches (cervicogenic?)  Presents with: Upper left back pain: Pain located added to her episode area, she denies neck pain per se, paresthesias, injury other than persistent cough. Neuro exam is normal.  Went to see a orthopedic doctor yesterday, was prescribed Robaxin and  prednisone. Most likely this is a MSK issue, doubt radiculopathy but that is in the differential. Recommend to proceed with methocarbamol, prednisone.  Also judicious use of Tylenol and ibuprofen, GI precautions discussed. Heating pad. Call if not better. Bronchitis: Cough for 2 weeks, bronchitis?.  The cough may have trigger day back pain, it is ongoing.  Recommend a Z-Pak on cough control with hydrocodone.  Drowsiness discussed. History of vertebral artery dissections: No symptoms to suggest such a problem at this point. Recommend to see PCP next week for a checkup  She notes that last night she slept a bit better she thinks due to the cough syrup that she was given  It also helped when her husband put pressure on the cramped up area in her back - he pressed on it with his fist and this helped  She also notes that her left deltoid seems to be twitching today  Ortho did some x-rays for her which I am not able to see, per her knowledge these were normal   Her mom Ruth Cooper is here with her today Shakura has not gotten a lot of routine health care recently They wonder if I can do basic labs for her which is fine   Patient Active Problem List   Diagnosis Date Noted  . Benign microscopic hematuria 11/05/2015  . Migraine without aura and without status migrainosus, not intractable  10/31/2015  . Visit for preventive health examination 10/31/2015  . Memory loss 10/31/2015  . Screening for malignant neoplasm of the cervix 11/23/2013  . Routine gynecological examination 11/23/2013  . Family history of colon cancer in mother 11/23/2013  . Family history of breast cancer in mother 11/23/2013  . Need for prophylactic vaccination with tetanus-diphtheria (Td) 06/03/2013  . Knee pain, right 06/03/2013  . Encounter to establish care 06/03/2013    Past Medical History:  Diagnosis Date  . Frequent headaches   . 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     Past Surgical History:  Procedure Laterality Date  . TONSILLECTOMY  1997  . WISDOM TOOTH EXTRACTION      Social History   Tobacco Use  . Smoking status: Never Smoker  . Smokeless tobacco: Never Used  Substance Use Topics  . Alcohol use: No  . Drug use: No    Family History  Problem Relation Age of Onset  . Colon cancer Mother        Living  . Breast cancer Mother   . Colon cancer Maternal Grandfather   . Breast cancer Maternal Grandmother   . Heart disease Paternal Grandfather   . Hypertension Paternal Grandfather   . Healthy Father        Living  . Alzheimer's disease Paternal Grandmother   . Healthy Brother        x1  . Healthy Son        x2  . Allergies Son   . Healthy Daughter        x1    No Known Allergies  Medication list has been reviewed and updated.  Current Outpatient Medications on File Prior to Visit  Medication Sig Dispense Refill  . azithromycin (ZITHROMAX Z-PAK) 250 MG tablet 2 tabs a day the first day, then 1 tab a day x 4 days 6 tablet 0  . HYDROcodone-homatropine (HYCODAN) 5-1.5 MG/5ML syrup Take 5 mLs by mouth at bedtime as needed for cough. 120 mL 0  . methocarbamol (ROBAXIN) 500 MG tablet Take 1 tablet by mouth 2 (two) times daily as needed.    . methylPREDNISolone (MEDROL DOSEPAK) 4 MG TBPK tablet As directed     No current facility-administered medications on file prior to visit.     Review of Systems:  As per HPI- otherwise negative.   Physical Examination: Vitals:   11/16/17 1113  BP: 138/88  Pulse: 82  Resp: 16  Temp: 98.1 F (36.7 C)  SpO2: 98%   Vitals:   11/16/17 1113  Weight: 162 lb (73.5 kg)  Height: 5' 6.5" (1.689 m)   Body mass index is 25.76 kg/m. Ideal Body Weight: Weight in (lb) to have BMI = 25: 156.9  GEN: WDWN, NAD, Non-toxic, A & O x 3, looks well and does not appear to be in any distress or pain at this time  Her mom is with her today  HEENT: Atraumatic, Normocephalic. Neck supple. No masses,  No LAD.  Bilateral TM wnl, oropharynx normal.  PEERL,EOMI.   Ears and Nose: No external deformity. CV: RRR, No M/G/R. No JVD. No thrill. No extra heart sounds. PULM: CTA B, no wheezes, crackles, rhonchi. No retractions. No resp. distress. No accessory muscle use. She indicates the area over the mid/superior left scapula as her area of concern. It is not tender to palpation at this time  No pain with ROM of the left shoulder of of  her neck EXTR: No c/c/e NEURO Normal gait.  PSYCH: Normally interactive. Conversant. Not depressed or anxious appearing.  Calm demeanor.    Assessment and Plan: Acute left-sided thoracic back pain  Cough - Plan: DG Chest 2 View  Screening for deficiency anemia - Plan: CBC  Screening for diabetes mellitus - Plan: Comprehensive metabolic panel, Hemoglobin A1c  Screening for hyperlipidemia - Plan: Lipid panel  Left thoracic pain- for 2 weeks. Has seen orthopedics and Dr. Larose Kells so far, came in today to make sure that I think her treatment is appropriate. She does seem to be getting better. Advised that I do agree with her treatment so far.  We will get a CXR for her today as well a she is concerned  She wonders about an MRI- advised that this would be best ordered by ortho if her pain does persist. She is seeing them in 2 weeks. She wonders about a hydrocodone pain medication.  Advised that she can use her hydrocodone cough syrup for this purpose, but cautioned regarding sedation  Will plan further follow- up pending labs.   Signed Lamar Blinks, MD  Dg Chest 2 View  Result Date: 11/16/2017 CLINICAL DATA:  Cough for couple weeks. EXAM: CHEST - 2 VIEW COMPARISON:  05/14/2009 FINDINGS: The heart size and mediastinal contours are within normal limits. Both lungs are clear. The visualized skeletal structures are unremarkable. IMPRESSION: No active cardiopulmonary disease. Electronically Signed   By: Kathreen Devoid   On: 11/16/2017 12:34   Message to pt regarding her  cxr-normal   Also received her labs Results for orders placed or performed in visit on 11/16/17  CBC  Result Value Ref Range   WBC 9.8 4.0 - 10.5 K/uL   RBC 4.64 3.87 - 5.11 Mil/uL   Platelets 243.0 150.0 - 400.0 K/uL   Hemoglobin 12.1 12.0 - 15.0 g/dL   HCT 36.9 36.0 - 46.0 %   MCV 79.5 78.0 - 100.0 fl   MCHC 32.8 30.0 - 36.0 g/dL   RDW 16.4 (H) 11.5 - 15.5 %  Comprehensive metabolic panel  Result Value Ref Range   Sodium 136 135 - 145 mEq/L   Potassium 4.4 3.5 - 5.1 mEq/L   Chloride 102 96 - 112 mEq/L   CO2 29 19 - 32 mEq/L   Glucose, Bld 94 70 - 99 mg/dL   BUN 16 6 - 23 mg/dL   Creatinine, Ser 0.59 0.40 - 1.20 mg/dL   Total Bilirubin 0.3 0.2 - 1.2 mg/dL   Alkaline Phosphatase 73 39 - 117 U/L   AST 14 0 - 37 U/L   ALT 9 0 - 35 U/L   Total Protein 7.4 6.0 - 8.3 g/dL   Albumin 4.6 3.5 - 5.2 g/dL   Calcium 9.6 8.4 - 10.5 mg/dL   GFR 116.03 >60.00 mL/min  Hemoglobin A1c  Result Value Ref Range   Hgb A1c MFr Bld 5.6 4.6 - 6.5 %  Lipid panel  Result Value Ref Range   Cholesterol 175 0 - 200 mg/dL   Triglycerides 65.0 0.0 - 149.0 mg/dL   HDL 59.20 >39.00 mg/dL   VLDL 13.0 0.0 - 40.0 mg/dL   LDL Cholesterol 103 (H) 0 - 99 mg/dL   Total CHOL/HDL Ratio 3    NonHDL 115.81    Message to pt

## 2017-11-16 NOTE — Patient Instructions (Addendum)
You can take the hydrocodone 21ml every 8 hours as needed for cough/ pain However remember that this can make you feel drowsy  Tennis ball massage, heat, ?ice can also be helpful for your symptoms  We will get some routine labs and a chest x-ray for you today - I will be in touch with your results asap If anything is changing or getting worse please alert me  Otherwise Dr. Percell Miller may wish to get an MRI for you if your symptoms are still ongoing when you see him next  Take care!

## 2017-11-21 ENCOUNTER — Other Ambulatory Visit: Payer: Self-pay | Admitting: Orthopedic Surgery

## 2017-11-21 DIAGNOSIS — M542 Cervicalgia: Secondary | ICD-10-CM | POA: Diagnosis not present

## 2017-11-24 ENCOUNTER — Ambulatory Visit
Admission: RE | Admit: 2017-11-24 | Discharge: 2017-11-24 | Disposition: A | Discharge status: Home / Self Care | Payer: BLUE CROSS/BLUE SHIELD | Source: Ambulatory Visit | Attending: Orthopedic Surgery | Admitting: Orthopedic Surgery

## 2017-11-24 DIAGNOSIS — M50222 Other cervical disc displacement at C5-C6 level: Secondary | ICD-10-CM | POA: Diagnosis not present

## 2017-11-24 DIAGNOSIS — M542 Cervicalgia: Secondary | ICD-10-CM

## 2017-11-25 ENCOUNTER — Encounter: Payer: Self-pay | Admitting: Family Medicine

## 2017-11-25 ENCOUNTER — Ambulatory Visit: Payer: Self-pay

## 2017-11-25 NOTE — Telephone Encounter (Signed)
Spoke with her- she has talked with Dr. Percell Miller today about her MRI and has an appt with [facility] next week They put her back on a steroid pack They also gave her tramadol to take for pain She has gabapentin as well. She has a good plan in place but I will send a mychart message which she can reply to in case she needs me over the weekend

## 2017-11-25 NOTE — Telephone Encounter (Signed)
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

## 2017-11-25 NOTE — Telephone Encounter (Signed)
Ret'd call to pt. Pt. crying.  Apologized that her mother called our office.  Reported she has been in constant pain in her left upper back and left shoulder since 9/5.  Stated she initially went to the doctor about this on 9/16.  Reported the pain has progressed into the left arm, since this past weekend.   Described the pain as "constant and dull" and is not able to get any relief; rated at 7/10.  Pt. reported the various  recommendations from Dr. Percell Miller, Dr. Larose Kells, and Dr. Lorelei Pont.  Stated she had an MRI yesterday, and had planned to call Dr. Debroah Loop office this morning to obtain results.  Reeported she recently was prescribed Gabapentin and Meloxicam from Dr. Percell Miller.  Stated she did not start the Gabapentin due to the side effects that she read about.  Reported she feels like there is a knot between the size of a golf ball and baseball, that is located just above the left shoulder blade.  Described that her left arm feels "very heavy"; denied numbness or tingling into the left arm.  Pt. Continued crying throughout the call.  Attempted to support her by acknowledging her feelings.    Phone call to [employer]; spoke with [place].  Advised of pt's. Complaints and of request for MRI results.  Was advised that Dr. Percell Miller was not in the office today.  Stated she would send a note to him re: the pt's complaints and request.  Stated that Dr. Percell Miller will likely call the pt.  Advised pt. Of the above.  Encouraged her to call Dr. Debroah Loop office if she has not received a call back by 1:00 PM today.  Will send this note to PCP, to make her aware.  Pt. Verb. Understanding and agrees with plan.     (no protocol used in this Triage call, as pt's complaints have been evaluated by 3 MD's recently, and she is awaiting MRI results)

## 2017-11-27 ENCOUNTER — Other Ambulatory Visit: Payer: BLUE CROSS/BLUE SHIELD

## 2017-11-28 ENCOUNTER — Telehealth: Payer: Self-pay

## 2017-11-28 NOTE — Telephone Encounter (Signed)
Taken care of on Friday

## 2017-11-28 NOTE — Telephone Encounter (Signed)
MRI reveals C5-6 extrusion and root impingment. Mother and pt. unaware per documentation. Routed to Dr. Lorelei Pont and Dr. Percell Miller to recommend next course of action.   Copied from [place] [phone]. Topic: General - Other >> Nov 25, 2017  9:58 AM Keene Breath wrote: Reason for CRM: Patient's mother called regarding patient's pain in her neck.  Stated that her daughter is in excruciating pain and the pain medicine does not seem to help.  Patient had an MRI yesterday and have not received the results.  Patient's mother would like a call back from the nurse to advise what the patient should do.  CB# [phone]. >> Nov 25, 2017 10:09 AM Damita Dunnings, CMA wrote: Pt's mom has also sent MyChart message regarding this. Pt has also called and has been routed to PCP.

## 2017-11-29 DIAGNOSIS — M4722 Other spondylosis with radiculopathy, cervical region: Secondary | ICD-10-CM | POA: Diagnosis not present

## 2017-11-29 DIAGNOSIS — M5030 Other cervical disc degeneration, unspecified cervical region: Secondary | ICD-10-CM | POA: Diagnosis not present

## 2017-11-29 DIAGNOSIS — M542 Cervicalgia: Secondary | ICD-10-CM | POA: Diagnosis not present

## 2017-11-29 DIAGNOSIS — M5000 Cervical disc disorder with myelopathy, unspecified cervical region: Secondary | ICD-10-CM | POA: Diagnosis not present

## 2017-12-08 DIAGNOSIS — M50322 Other cervical disc degeneration at C5-C6 level: Secondary | ICD-10-CM | POA: Diagnosis not present

## 2017-12-08 DIAGNOSIS — M5020 Other cervical disc displacement, unspecified cervical region: Secondary | ICD-10-CM | POA: Diagnosis not present

## 2017-12-08 DIAGNOSIS — M50122 Cervical disc disorder at C5-C6 level with radiculopathy: Secondary | ICD-10-CM | POA: Diagnosis not present

## 2017-12-08 DIAGNOSIS — M4722 Other spondylosis with radiculopathy, cervical region: Secondary | ICD-10-CM | POA: Diagnosis not present

## 2017-12-08 HISTORY — PX: BACK SURGERY: SHX140
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
## 2017-12-19 ENCOUNTER — Ambulatory Visit: Payer: BLUE CROSS/BLUE SHIELD | Admitting: Family Medicine

## 2017-12-19 ENCOUNTER — Other Ambulatory Visit (HOSPITAL_COMMUNITY)
Admission: RE | Admit: 2017-12-19 | Discharge: 2017-12-19 | Disposition: A | Discharge status: Home / Self Care | Payer: BLUE CROSS/BLUE SHIELD | Source: Ambulatory Visit | Attending: Family Medicine | Admitting: Family Medicine

## 2017-12-19 ENCOUNTER — Encounter: Payer: Self-pay | Admitting: Family Medicine

## 2017-12-19 VITALS — BP 130/90 | HR 100 | Temp 98.1°F | Resp 16 | Ht 66.5 in | Wt 155.0 lb

## 2017-12-19 DIAGNOSIS — Z1239 Encounter for other screening for malignant neoplasm of breast: Secondary | ICD-10-CM | POA: Diagnosis not present

## 2017-12-19 DIAGNOSIS — M546 Pain in thoracic spine: Secondary | ICD-10-CM | POA: Diagnosis not present

## 2017-12-19 DIAGNOSIS — Z124 Encounter for screening for malignant neoplasm of cervix: Secondary | ICD-10-CM

## 2017-12-19 NOTE — Patient Instructions (Addendum)
It was good to see you again today- we are so glad that you are feeling better!   We got a pap for you today, I'll be in touch with your report asap Do be sure to get your flu shot this fall- also it looks like you are due for a mammogram at your convenience Let's plan to visit in 6 months. Then we can go to annual check-ups assuming all is well

## 2017-12-20 LAB — CYTOLOGY - PAP
Adequacy: ABSENT
Diagnosis: NEGATIVE
HPV: NOT DETECTED

## 2017-12-21 ENCOUNTER — Encounter: Payer: Self-pay | Admitting: Family Medicine

## 2017-12-21 DIAGNOSIS — M5000 Cervical disc disorder with myelopathy, unspecified cervical region: Secondary | ICD-10-CM | POA: Diagnosis not present

## 2018-01-02 ENCOUNTER — Ambulatory Visit: Payer: BLUE CROSS/BLUE SHIELD | Admitting: Family Medicine

## 2018-02-16 DIAGNOSIS — Z9889 Other specified postprocedural states: Secondary | ICD-10-CM | POA: Diagnosis not present

## 2018-03-01 HISTORY — PX: CERVICAL DISC ARTHROPLASTY: SHX587

## 2018-03-01 HISTORY — PX: ENDOMETRIAL ABLATION: SHX621

## 2018-04-06 ENCOUNTER — Other Ambulatory Visit: Payer: Self-pay | Admitting: Family Medicine

## 2018-04-06 DIAGNOSIS — Z1231 Encounter for screening mammogram for malignant neoplasm of breast: Secondary | ICD-10-CM

## 2018-04-11 ENCOUNTER — Ambulatory Visit
Admission: RE | Admit: 2018-04-11 | Discharge: 2018-04-11 | Disposition: A | Discharge status: Home / Self Care | Payer: BLUE CROSS/BLUE SHIELD | Source: Ambulatory Visit | Attending: Family Medicine | Admitting: Family Medicine

## 2018-04-11 DIAGNOSIS — Z1231 Encounter for screening mammogram for malignant neoplasm of breast: Secondary | ICD-10-CM

## 2018-04-12 ENCOUNTER — Other Ambulatory Visit: Payer: Self-pay | Admitting: Family Medicine

## 2018-04-12 DIAGNOSIS — R928 Other abnormal and inconclusive findings on diagnostic imaging of breast: Secondary | ICD-10-CM

## 2018-04-14 ENCOUNTER — Ambulatory Visit
Admission: RE | Admit: 2018-04-14 | Discharge: 2018-04-14 | Disposition: A | Discharge status: Home / Self Care | Payer: BLUE CROSS/BLUE SHIELD | Source: Ambulatory Visit | Attending: Family Medicine | Admitting: Family Medicine

## 2018-04-14 ENCOUNTER — Ambulatory Visit: Payer: BLUE CROSS/BLUE SHIELD

## 2018-04-14 DIAGNOSIS — R922 Inconclusive mammogram: Secondary | ICD-10-CM | POA: Diagnosis not present

## 2018-04-14 DIAGNOSIS — R928 Other abnormal and inconclusive findings on diagnostic imaging of breast: Secondary | ICD-10-CM

## 2018-04-19 ENCOUNTER — Telehealth: Payer: Self-pay | Admitting: *Deleted

## 2018-04-19 NOTE — Telephone Encounter (Signed)
Received Physician Orders from [facility]; forwarded to provider/SLS 02/19

## 2018-06-18 ENCOUNTER — Encounter: Payer: Self-pay | Admitting: Family Medicine

## 2018-06-21 ENCOUNTER — Ambulatory Visit: Payer: BLUE CROSS/BLUE SHIELD | Admitting: Family Medicine

## 2018-12-24 NOTE — Progress Notes (Addendum)
[facility] at [facility] [place], [place], [place] [address] [phone] [id number]  Date:  12/28/2018   Name:  Ruth Cooper   DOB:  Jul 28, 1970   MRN:  [id number]  PCP:  Darreld Mclean, MD    Chief Complaint: Migraine (6 month followup)   History of Present Illness:  Ruth Cooper is a 48 y.o. 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  This is fine   Pap a year ago  She is a pt at [facility]- she has already had colon cancer screening There is a family history of colon cancer- her mom-so she started screening early I have a colon from 2015 on her chart but pt thinks she had more recently per [facility]- I will request records from [facility]  She also notes regular menses that occur every month or so, last for 5-6 days and some heavy bleeding for 2-3 days with clots.  She is interested in possible treatment options for her mnorrhagia   Patient Active Problem List   Diagnosis Date Noted  . Benign microscopic hematuria 11/05/2015  . Migraine without aura and without status migrainosus, not intractable 10/31/2015  . Memory loss 10/31/2015  . Knee pain, right 06/03/2013    Past Medical History:  Diagnosis Date  . Frequent headaches   . 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    Past Surgical History:  Procedure Laterality Date  . BACK SURGERY  12/08/2017   cervical disc    . TONSILLECTOMY  1997  . WISDOM TOOTH EXTRACTION      Social History   Tobacco Use  . Smoking status: Never Smoker  . Smokeless tobacco: Never Used  Substance Use Topics  . Alcohol use: No  . Drug use: No    Family History  Problem Relation Age of Onset  . Colon cancer Mother        Living  . Breast cancer Mother 55       65  . Colon cancer Maternal Grandfather   . Breast cancer Maternal Grandmother   . Heart disease Paternal Grandfather   . Hypertension Paternal Grandfather   . Healthy Father        Living  . Alzheimer's disease Paternal Grandmother   . Healthy Brother        x1  . Healthy Son        x2  . Allergies Son   . Healthy Daughter        x1    No Known Allergies  Medication list has been reviewed and updated.  No current outpatient medications on file prior to visit.   No current facility-administered medications on file prior to visit.     Review of Systems:  As per HPI- otherwise negative.  No CP  or SOB No fever or chills  Physical Examination: Vitals:   12/28/18 0818  BP: (!) 142/88  Pulse: 75  Resp: 16  Temp: 97.7 F (36.5 C)  SpO2: 97%   Vitals:   12/28/18 0818  Weight: 161 lb (73 kg)  Height: 5' 6.5" (1.689 m)   Body mass index is 25.6 kg/m. Ideal Body Weight: Weight in (lb) to have BMI = 25: 156.9  GEN: WDWN, NAD, Non-toxic, A & O x 3, slight overweight, looks well  HEENT: Atraumatic, Normocephalic. Neck supple. No masses, No LAD.  TM wnl bilaterally  Ears and Nose: No external deformity. CV: RRR, No M/G/R. No JVD. No thrill. No extra heart sounds. PULM: CTA B, no wheezes, crackles, rhonchi. No retractions. No resp. distress. No accessory muscle use. ABD: S, NT, ND. No rebound. No HSM. EXTR: No c/c/e NEURO Normal gait.  PSYCH: Normally interactive. Conversant. Not depressed or anxious appearing.  Calm demeanor.   Wt Readings from Last 3 Encounters:  12/28/18 161 lb (73 kg)  12/19/17 155 lb (70.3 kg)  11/16/17 162  lb (73.5 kg)    Assessment and Plan: Physical exam  Screening for deficiency anemia - Plan: CBC, Ferritin  Menorrhagia with regular cycle - Plan: CBC, Ferritin, Ambulatory referral to Obstetrics / Gynecology, CANCELED: Ambulatory referral to Obstetrics / Gynecology  Screening for diabetes mellitus - Plan: Comprehensive metabolic panel, Hemoglobin A1c  Screening for hyperlipidemia - Plan: Lipid panel  Screening for thyroid disorder - Plan: TSH  CPE today Requested colon report from [facility] Referral to GYN to discuss options for menorrhagia Encouraged her to work on diet and exercise, she has gained a few lbs  Will plan further follow- up pending labs.   Signed Lamar Blinks, MD  I received her colonoscopy report from [facility].  Her most recent screening was in November 2015.  This would mean she is now 5 years out and due for rescreening-message sent to patient  Received her labs 10/30- message to pt  Results for orders placed or performed in visit on 12/28/18  CBC  Result Value Ref Range   WBC 4.7 4.0 - 10.5 K/uL   RBC 4.68 3.87 - 5.11 Mil/uL   Platelets 195.0 150.0 - 400.0 K/uL   Hemoglobin 11.8 (L) 12.0 - 15.0 g/dL   HCT 36.7 36.0 - 46.0 %   MCV 78.5 78.0 - 100.0 fl   MCHC 32.2 30.0 - 36.0 g/dL   RDW 18.0 (H) 11.5 - 15.5 %  Comprehensive metabolic panel  Result Value Ref Range   Sodium 137 135 - 145 mEq/L   Potassium 4.3 3.5 - 5.1 mEq/L   Chloride 105 96 - 112 mEq/L   CO2 27 19 - 32 mEq/L   Glucose, Bld 99 70 - 99 mg/dL   BUN 14 6 - 23 mg/dL   Creatinine, Ser 0.73 0.40 - 1.20 mg/dL   Total Bilirubin 0.4 0.2 - 1.2 mg/dL   Alkaline Phosphatase 65 39 - 117 U/L   AST 15 0 - 37 U/L   ALT 7 0 - 35 U/L   Total Protein 7.0 6.0 - 8.3 g/dL   Albumin 4.6 3.5 - 5.2 g/dL   Calcium 8.9 8.4 - 10.5 mg/dL   GFR 84.98 >60.00 mL/min  Ferritin  Result Value Ref Range   Ferritin 4.3 (L) 10.0 - 291.0 ng/mL  Hemoglobin A1c  Result Value Ref Range   Hgb A1c MFr Bld 5.4 4.6 -  6.5 %  Lipid panel  Result Value  Ref Range   Cholesterol 172 0 - 200 mg/dL   Triglycerides 65.0 0.0 - 149.0 mg/dL   HDL 47.90 >39.00 mg/dL   VLDL 13.0 0.0 - 40.0 mg/dL   LDL Cholesterol 111 (H) 0 - 99 mg/dL   Total CHOL/HDL Ratio 4    NonHDL 123.73   TSH  Result Value Ref Range   TSH 1.19 0.35 - 4.50 uIU/mL

## 2018-12-27 ENCOUNTER — Other Ambulatory Visit: Payer: Self-pay

## 2018-12-28 ENCOUNTER — Encounter: Payer: Self-pay | Admitting: Family Medicine

## 2018-12-28 ENCOUNTER — Ambulatory Visit (INDEPENDENT_AMBULATORY_CARE_PROVIDER_SITE_OTHER): Payer: BC Managed Care – PPO | Admitting: Family Medicine

## 2018-12-28 VITALS — BP 132/80 | HR 75 | Temp 97.7°F | Resp 16 | Ht 66.5 in | Wt 161.0 lb

## 2018-12-28 DIAGNOSIS — Z131 Encounter for screening for diabetes mellitus: Secondary | ICD-10-CM

## 2018-12-28 DIAGNOSIS — Z130 Encounter for screening for diseases of the blood and blood-forming organs and certain disorders involving the immune mechanism: Secondary | ICD-10-CM | POA: Diagnosis not present

## 2018-12-28 DIAGNOSIS — Z0000 Encounter for general adult medical examination without abnormal findings: Secondary | ICD-10-CM | POA: Diagnosis not present

## 2018-12-28 DIAGNOSIS — N920 Excessive and frequent menstruation with regular cycle: Secondary | ICD-10-CM

## 2018-12-28 DIAGNOSIS — Z13220 Encounter for screening for lipoid disorders: Secondary | ICD-10-CM

## 2018-12-28 DIAGNOSIS — Z1329 Encounter for screening for other suspected endocrine disorder: Secondary | ICD-10-CM

## 2018-12-28 LAB — COMPREHENSIVE METABOLIC PANEL
ALT: 7 U/L (ref 0–35)
AST: 15 U/L (ref 0–37)
Albumin: 4.6 g/dL (ref 3.5–5.2)
Alkaline Phosphatase: 65 U/L (ref 39–117)
BUN: 14 mg/dL (ref 6–23)
CO2: 27 mEq/L (ref 19–32)
Calcium: 8.9 mg/dL (ref 8.4–10.5)
Chloride: 105 mEq/L (ref 96–112)
Creatinine, Ser: 0.73 mg/dL (ref 0.40–1.20)
GFR: 84.98 mL/min (ref >60.00)
Glucose, Bld: 99 mg/dL (ref 70–99)
Potassium: 4.3 mEq/L (ref 3.5–5.1)
Sodium: 137 mEq/L (ref 135–145)
Total Bilirubin: 0.4 mg/dL (ref 0.2–1.2)
Total Protein: 7 g/dL (ref 6.0–8.3)

## 2018-12-28 LAB — CBC
HCT: 36.7 % (ref 36.0–46.0)
Hemoglobin: 11.8 g/dL — ABNORMAL LOW (ref 12.0–15.0)
MCHC: 32.2 g/dL (ref 30.0–36.0)
MCV: 78.5 fl (ref 78.0–100.0)
Platelets: 195 10*3/uL (ref 150.0–400.0)
RBC: 4.68 Mil/uL (ref 3.87–5.11)
RDW: 18 % — ABNORMAL HIGH (ref 11.5–15.5)
WBC: 4.7 10*3/uL (ref 4.0–10.5)

## 2018-12-28 LAB — LIPID PANEL
Cholesterol: 172 mg/dL (ref 0–200)
HDL: 47.9 mg/dL (ref >39.00)
LDL Cholesterol: 111 mg/dL — ABNORMAL HIGH (ref 0–99)
NonHDL: 123.73
Total CHOL/HDL Ratio: 4
Triglycerides: 65 mg/dL (ref 0.0–149.0)
VLDL: 13 mg/dL (ref 0.0–40.0)

## 2018-12-28 LAB — HEMOGLOBIN A1C: Hgb A1c MFr Bld: 5.4 % (ref 4.6–6.5)

## 2018-12-28 NOTE — Patient Instructions (Signed)
It was good to see you again today!  I will request your most recent GI report to make sure you are up to date on your colonoscopy  You have put on a few labs and your BP is slightly up.  Work on physical activity and diet to reverse these changes I will also set you up to see GYN to help manage your heavy periods.    I will be in touch with your labs asap  Take care   Health Maintenance, Female Adopting a healthy lifestyle and getting preventive care are important in promoting health and wellness. Ask your health care provider about:  The right schedule for you to have regular tests and exams.  Things you can do on your own to prevent diseases and keep yourself healthy. What should I know about diet, weight, and exercise? Eat a healthy diet   Eat a diet that includes plenty of vegetables, fruits, low-fat dairy products, and lean protein.  Do not eat a lot of foods that are high in solid fats, added sugars, or sodium. Maintain a healthy weight Body mass index (BMI) is used to identify weight problems. It estimates body fat based on height and weight. Your health care provider can help determine your BMI and help you achieve or maintain a healthy weight. Get regular exercise Get regular exercise. This is one of the most important things you can do for your health. Most adults should:  Exercise for at least 150 minutes each week. The exercise should increase your heart rate and make you sweat (moderate-intensity exercise).  Do strengthening exercises at least twice a week. This is in addition to the moderate-intensity exercise.  Spend less time sitting. Even light physical activity can be beneficial. Watch cholesterol and blood lipids Have your blood tested for lipids and cholesterol at 48 years of age, then have this test every 5 years. Have your cholesterol levels checked more often if:  Your lipid or cholesterol levels are high.  You are older than 48 years of age.  You are  at high risk for heart disease. What should I know about cancer screening? Depending on your health history and family history, you may need to have cancer screening at various ages. This may include screening for:  Breast cancer.  Cervical cancer.  Colorectal cancer.  Skin cancer.  Lung cancer. What should I know about heart disease, diabetes, and high blood pressure? Blood pressure and heart disease  High blood pressure causes heart disease and increases the risk of stroke. This is more likely to develop in people who have high blood pressure readings, are of African descent, or are overweight.  Have your blood pressure checked: ? Every 3-5 years if you are 31-61 years of age. ? Every year if you are 80 years old or older. Diabetes Have regular diabetes screenings. This checks your fasting blood sugar level. Have the screening done:  Once every three years after age 22 if you are at a normal weight and have a low risk for diabetes.  More often and at a younger age if you are overweight or have a high risk for diabetes. What should I know about preventing infection? Hepatitis B If you have a higher risk for hepatitis B, you should be screened for this virus. Talk with your health care provider to find out if you are at risk for hepatitis B infection. Hepatitis C Testing is recommended for:  Everyone born from [birth year] through 1965.  Anyone with known  risk factors for hepatitis C. Sexually transmitted infections (STIs)  Get screened for STIs, including gonorrhea and chlamydia, if: ? You are sexually active and are younger than 48 years of age. ? You are older than 48 years of age and your health care provider tells you that you are at risk for this type of infection. ? Your sexual activity has changed since you were last screened, and you are at increased risk for chlamydia or gonorrhea. Ask your health care provider if you are at risk.  Ask your health care provider about  whether you are at high risk for HIV. Your health care provider may recommend a prescription medicine to help prevent HIV infection. If you choose to take medicine to prevent HIV, you should first get tested for HIV. You should then be tested every 3 months for as long as you are taking the medicine. Pregnancy  If you are about to stop having your period (premenopausal) and you may become pregnant, seek counseling before you get pregnant.  Take 400 to 800 micrograms (mcg) of folic acid every day if you become pregnant.  Ask for birth control (contraception) if you want to prevent pregnancy. Osteoporosis and menopause Osteoporosis is a disease in which the bones lose minerals and strength with aging. This can result in bone fractures. If you are 77 years old or older, or if you are at risk for osteoporosis and fractures, ask your health care provider if you should:  Be screened for bone loss.  Take a calcium or vitamin D supplement to lower your risk of fractures.  Be given hormone replacement therapy (HRT) to treat symptoms of menopause. Follow these instructions at home: Lifestyle  Do not use any products that contain nicotine or tobacco, such as cigarettes, e-cigarettes, and chewing tobacco. If you need help quitting, ask your health care provider.  Do not use street drugs.  Do not share needles.  Ask your health care provider for help if you need support or information about quitting drugs. Alcohol use  Do not drink alcohol if: ? Your health care provider tells you not to drink. ? You are pregnant, may be pregnant, or are planning to become pregnant.  If you drink alcohol: ? Limit how much you use to 0-1 drink a day. ? Limit intake if you are breastfeeding.  Be aware of how much alcohol is in your drink. In the [place] one drink equals one 12 oz bottle of beer (355 mL), one 5 oz glass of wine (148 mL), or one 1 oz glass of hard liquor (44 mL). General instructions  Schedule  regular health, dental, and eye exams.  Stay current with your vaccines.  Tell your health care provider if: ? You often feel depressed. ? You have ever been abused or do not feel safe at home. Summary  Adopting a healthy lifestyle and getting preventive care are important in promoting health and wellness.  Follow your health care provider's instructions about healthy diet, exercising, and getting tested or screened for diseases.  Follow your health care provider's instructions on monitoring your cholesterol and blood pressure. This information is not intended to replace advice given to you by your health care provider. Make sure you discuss any questions you have with your health care provider. Document Released: 08/31/2010 Document Revised: 02/08/2018 Document Reviewed: 02/08/2018 Elsevier Patient Education  2020 [employer].

## 2018-12-29 ENCOUNTER — Encounter: Payer: Self-pay | Admitting: Family Medicine

## 2018-12-29 LAB — TSH: TSH: 1.19 u[IU]/mL (ref 0.35–4.50)

## 2018-12-29 LAB — FERRITIN: Ferritin: 4.3 ng/mL — ABNORMAL LOW (ref 10.0–291.0)

## 2019-01-08 DIAGNOSIS — N920 Excessive and frequent menstruation with regular cycle: Secondary | ICD-10-CM | POA: Diagnosis not present

## 2019-01-17 ENCOUNTER — Telehealth: Payer: Self-pay | Admitting: Gastroenterology

## 2019-01-24 ENCOUNTER — Encounter: Payer: Self-pay | Admitting: Gastroenterology

## 2019-01-24 NOTE — Telephone Encounter (Signed)
Colon scheduled on 03/09/19 at [facility].

## 2019-01-24 NOTE — Telephone Encounter (Signed)
Left message on house phone to call back to schedule direct colon. Voice mail in mobile number was full.  Dr. Ardis Hughs reviewed records and advise to schedule pt for direct colon.

## 2019-02-02 DIAGNOSIS — N950 Postmenopausal bleeding: Secondary | ICD-10-CM | POA: Diagnosis not present

## 2019-02-02 DIAGNOSIS — N920 Excessive and frequent menstruation with regular cycle: Secondary | ICD-10-CM | POA: Diagnosis not present

## 2019-02-04 ENCOUNTER — Encounter: Payer: Self-pay | Admitting: Family Medicine

## 2019-02-07 DIAGNOSIS — N840 Polyp of corpus uteri: Secondary | ICD-10-CM | POA: Diagnosis not present

## 2019-02-07 DIAGNOSIS — N920 Excessive and frequent menstruation with regular cycle: Secondary | ICD-10-CM | POA: Diagnosis not present

## 2019-02-21 ENCOUNTER — Ambulatory Visit (AMBULATORY_SURGERY_CENTER): Payer: BC Managed Care – PPO | Admitting: *Deleted

## 2019-02-21 ENCOUNTER — Other Ambulatory Visit: Payer: Self-pay

## 2019-02-21 VITALS — Temp 97.6°F | Ht 66.0 in | Wt 160.0 lb

## 2019-02-21 DIAGNOSIS — Z86010 Personal history of colonic polyps: Secondary | ICD-10-CM

## 2019-02-21 DIAGNOSIS — Z1159 Encounter for screening for other viral diseases: Secondary | ICD-10-CM

## 2019-02-21 DIAGNOSIS — Z800 Family history of malignant neoplasm of digestive organs: Secondary | ICD-10-CM

## 2019-02-21 MED ORDER — NA SULFATE-K SULFATE-MG SULF 17.5-3.13-1.6 GM/177ML PO SOLN: 1.0000 | Freq: Once | ORAL | 0 refills | Status: AC

## 2019-02-21 NOTE — Progress Notes (Signed)

## 2019-03-05 ENCOUNTER — Encounter: Payer: Self-pay | Admitting: Gastroenterology

## 2019-03-06 ENCOUNTER — Other Ambulatory Visit: Payer: Self-pay | Admitting: Gastroenterology

## 2019-03-06 ENCOUNTER — Telehealth: Payer: Self-pay | Admitting: Gastroenterology

## 2019-03-06 ENCOUNTER — Ambulatory Visit (INDEPENDENT_AMBULATORY_CARE_PROVIDER_SITE_OTHER): Payer: BC Managed Care – PPO

## 2019-03-06 DIAGNOSIS — Z1159 Encounter for screening for other viral diseases: Secondary | ICD-10-CM | POA: Diagnosis not present

## 2019-03-06 LAB — SARS CORONAVIRUS 2 (TAT 6-24 HRS): SARS Coronavirus 2: NEGATIVE

## 2019-03-06 NOTE — Telephone Encounter (Signed)
Ms. Bettinger called and spoke to me today about benefits letter she received.  She said her insurance company told her if her procedure was coded as preventative, it would be covered.  I told her that her dx codes are hx of polyps and family hx colon cancer.  She said she was never told she had polyps, and she said she was never told the results of her pathology report with Dr. Amedeo Plenty five years ago.  She would like to speak to a nurse or the provider about this before she has her procedure at the end of the week.

## 2019-03-06 NOTE — Telephone Encounter (Signed)
The pt has been advised to call Dr Amedeo Plenty office regarding not being told about path results.  I have also advised that per our records the diagnosis used is correct.  She states she was speaking with insurance company on the other line and thanked me for returning her call.

## 2019-03-09 ENCOUNTER — Other Ambulatory Visit: Payer: Self-pay

## 2019-03-09 ENCOUNTER — Ambulatory Visit (AMBULATORY_SURGERY_CENTER): Payer: BC Managed Care – PPO | Admitting: Gastroenterology

## 2019-03-09 ENCOUNTER — Encounter: Payer: Self-pay | Admitting: Gastroenterology

## 2019-03-09 ENCOUNTER — Other Ambulatory Visit: Payer: Self-pay | Admitting: Gastroenterology

## 2019-03-09 VITALS — BP 122/78 | HR 84 | Temp 98.1°F | Resp 17 | Ht 66.0 in | Wt 160.0 lb

## 2019-03-09 DIAGNOSIS — D124 Benign neoplasm of descending colon: Secondary | ICD-10-CM

## 2019-03-09 DIAGNOSIS — K635 Polyp of colon: Secondary | ICD-10-CM | POA: Diagnosis not present

## 2019-03-09 DIAGNOSIS — Z86010 Personal history of colonic polyps: Secondary | ICD-10-CM

## 2019-03-09 DIAGNOSIS — Z1211 Encounter for screening for malignant neoplasm of colon: Secondary | ICD-10-CM | POA: Diagnosis not present

## 2019-03-09 MED ORDER — SODIUM CHLORIDE 0.9 % IV SOLN: 500.0000 mL | Freq: Once | INTRAVENOUS | Status: DC

## 2019-03-09 NOTE — Op Note (Signed)
[facility] [place] Patient Name: Ruth Cooper Procedure Date: 03/09/2019 8:39 AM MRN: [id number] Endoscopist: Milus Banister , MD Age: 49 Referring MD:  Date of Birth: 27-May-1970 Gender: Female Account #: [id number] Procedure:                Colonoscopy Indications:              Mother had colon cancer in her 86s. Colonoscoy 2015                            [facility] found a single subCM adenoma Medicines:                Monitored Anesthesia Care Procedure:                Pre-Anesthesia Assessment:                           - Prior to the procedure, a History and Physical                            was performed, and patient medications and                            allergies were reviewed. The patient's tolerance of                            previous anesthesia was also reviewed. The risks                            and benefits of the procedure and the sedation                            options and risks were discussed with the patient.                            All questions were answered, and informed consent                            was obtained. Prior Anticoagulants: The patient has                            taken no previous anticoagulant or antiplatelet                            agents. ASA Grade Assessment: II - A patient with                            mild systemic disease. After reviewing the risks                            and benefits, the patient was deemed in                            satisfactory condition to undergo the procedure.  After obtaining informed consent, the colonoscope                            was passed under direct vision. Throughout the                            procedure, the patient's blood pressure, pulse, and                            oxygen saturations were monitored continuously. The                            Colonoscope was introduced through the anus and                            advanced to the the  cecum, identified by                            appendiceal orifice and ileocecal valve. The                            colonoscopy was performed without difficulty. The                            patient tolerated the procedure well. The quality                            of the bowel preparation was good. The ileocecal                            valve, appendiceal orifice, and rectum were                            photographed. Scope In: 8:49:13 AM Scope Out: 9:06:25 AM Scope Withdrawal Time: 0 hours 12 minutes 59 seconds  Total Procedure Duration: 0 hours 17 minutes 12 seconds  Findings:                 A 5 mm polyp was found in the descending colon. The                            polyp was sessile. The polyp was removed with a                            cold snare. Resection and retrieval were complete.                           The exam was otherwise without abnormality on                            direct and retroflexion views. Complications:            No immediate complications. Estimated blood loss:  None. Estimated Blood Loss:     Estimated blood loss: none. Impression:               - One 5 mm polyp in the descending colon, removed                            with a cold snare. Resected and retrieved.                           - The examination was otherwise normal on direct                            and retroflexion views. Recommendation:           - Patient has a contact number available for                            emergencies. The signs and symptoms of potential                            delayed complications were discussed with the                            patient. Return to normal activities tomorrow.                            Written discharge instructions were provided to the                            patient.                           - Resume previous diet.                           - Continue present medications.                            - Await pathology results. Milus Banister, MD 03/09/2019 9:09:22 AM This report has been signed electronically.

## 2019-03-09 NOTE — Progress Notes (Signed)
Called to room to assist during endoscopic procedure.  Patient ID and intended procedure confirmed with present staff. Received instructions for my participation in the procedure from the performing physician.  

## 2019-03-09 NOTE — Progress Notes (Signed)
PT taken to PACU. Monitors in place. VSS. Report given to RN. 

## 2019-03-09 NOTE — Progress Notes (Signed)
Temp CH  VS KA  Pt's states no medical or surgical changes since previsit or office visit.

## 2019-03-09 NOTE — Patient Instructions (Signed)
Polyp removed.     YOU HAD AN ENDOSCOPIC PROCEDURE TODAY AT [facility]:   Refer to the procedure report that was given to you for any specific questions about what was found during the examination.  If the procedure report does not answer your questions, please call your gastroenterologist to clarify.  If you requested that your care partner not be given the details of your procedure findings, then the procedure report has been included in a sealed envelope for you to review at your convenience later.  YOU SHOULD EXPECT: Some feelings of bloating in the abdomen. Passage of more gas than usual.  Walking can help get rid of the air that was put into your GI tract during the procedure and reduce the bloating. If you had a lower endoscopy (such as a colonoscopy or flexible sigmoidoscopy) you may notice spotting of blood in your stool or on the toilet paper. If you underwent a bowel prep for your procedure, you may not have a normal bowel movement for a few days.  Please Note:  You might notice some irritation and congestion in your nose or some drainage.  This is from the oxygen used during your procedure.  There is no need for concern and it should clear up in a day or so.  SYMPTOMS TO REPORT IMMEDIATELY:   Following lower endoscopy (colonoscopy or flexible sigmoidoscopy):  Excessive amounts of blood in the stool  Significant tenderness or worsening of abdominal pains  Swelling of the abdomen that is new, acute  Fever of 100F or higher   For urgent or emergent issues, a gastroenterologist can be reached at any hour by calling [phone].   DIET:  We do recommend a small meal at first, but then you may proceed to your regular diet.  Drink plenty of fluids but you should avoid alcoholic beverages for 24 hours.  ACTIVITY:  You should plan to take it easy for the rest of today and you should NOT DRIVE or use heavy machinery until tomorrow (because of the sedation medicines  used during the test).    FOLLOW UP: Our staff will call the number listed on your records 48-72 hours following your procedure to check on you and address any questions or concerns that you may have regarding the information given to you following your procedure. If we do not reach you, we will leave a message.  We will attempt to reach you two times.  During this call, we will ask if you have developed any symptoms of COVID 19. If you develop any symptoms (ie: fever, flu-like symptoms, shortness of breath, cough etc.) before then, please call [phone].  If you test positive for Covid 19 in the 2 weeks post procedure, please call and report this information to [place].    If any biopsies were taken you will be contacted by phone or by letter within the next 1-3 weeks.  Please call us at [phone] if you have not heard about the biopsies in 3 weeks.    SIGNATURES/CONFIDENTIALITY: You and/or your care partner have signed paperwork which will be entered into your electronic medical record.  These signatures attest to the fact that that the information above on your After Visit Summary has been reviewed and is understood.  Full responsibility of the confidentiality of this discharge information lies with you and/or your care-partner.

## 2019-03-13 ENCOUNTER — Encounter: Payer: Self-pay | Admitting: Family Medicine

## 2019-03-13 ENCOUNTER — Telehealth: Payer: Self-pay

## 2019-03-13 NOTE — Telephone Encounter (Signed)
  Follow up Call-  Call back number 03/09/2019  Post procedure Call Back phone  # [phone]  Permission to leave phone message Yes  Some recent data might be hidden     Patient questions:  Do you have a fever, pain , or abdominal swelling? No. Pain Score  0 *  Have you tolerated food without any problems? Yes.    Have you been able to return to your normal activities? Yes.    Do you have any questions about your discharge instructions: Diet   No. Medications  No. Follow up visit  No.  Do you have questions or concerns about your Care? No.  Actions: * If pain score is 4 or above: No action needed, pain <4.  1. Have you developed a fever since your procedure? no  2.   Have you had an respiratory symptoms (SOB or cough) since your procedure? no  3.   Have you tested positive for COVID 19 since your procedure no  4.   Have you had any family members/close contacts diagnosed with the COVID 19 since your procedure?  no   If yes to any of these questions please route to Joylene John, RN and Alphonsa Gin, Therapist, sports.

## 2019-03-15 ENCOUNTER — Encounter: Payer: Self-pay | Admitting: Gastroenterology

## 2019-04-16 ENCOUNTER — Other Ambulatory Visit: Payer: Self-pay | Admitting: Family Medicine

## 2019-04-16 DIAGNOSIS — Z1231 Encounter for screening mammogram for malignant neoplasm of breast: Secondary | ICD-10-CM

## 2019-04-18 ENCOUNTER — Other Ambulatory Visit: Payer: Self-pay

## 2019-04-18 ENCOUNTER — Ambulatory Visit
Admission: RE | Admit: 2019-04-18 | Discharge: 2019-04-18 | Disposition: A | Discharge status: Home / Self Care | Payer: BC Managed Care – PPO | Source: Ambulatory Visit

## 2019-04-18 DIAGNOSIS — Z1231 Encounter for screening mammogram for malignant neoplasm of breast: Secondary | ICD-10-CM | POA: Diagnosis not present

## 2019-06-03 NOTE — Patient Instructions (Addendum)
It was good to see you again today, I will be in touch with your labs as soon as possible Please let dr Helane Rima know how things go with your bleeding- if you don't improve they may have another option for you or could re-do your ablation  Certainly tylenol would be ok to use for occasional joint pain.  Try an ankle support especially for doing work on uneven ground!  Let me know if you would like to see orthopedics Otherwise we can plan to visit in about 6 months

## 2019-06-03 NOTE — Progress Notes (Addendum)
[facility] [address] [place], [place] [address] [phone]  Date:  06/04/2019   Name:  Ruth Cooper   DOB:  09-Feb-1971   MRN:  [id number]  PCP:  Darreld Mclean, MD    Chief Complaint: Lab Work   History of Present Illness:  Ruth Cooper is a 49 y.o. very pleasant female patient who presents with the following:  Here today for a recheck visit and iron level.  History of migraine headache and memory loss She has 3 children 2 in high school and one recently graduated from college.  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 Dian Queen She had an ablation- this has done overall well, but has continued to have menses.   She was also due for colonoscopy, she has seen Dr. Oretha Caprice and had a colonoscopy in January  In October her hemoglobin was 11.8, ferritin 4.3  She had her first covid vaccine- 2nd dose is scheduled for the next couple of weeks She does notice that her right knee and left ankle are painful at times  She had right knee films about 5 years ago after a fall, this did show some arthritis No particular left ankle injury, but she notes that her ankle will sometimes pop audibly and can be painful Patient Active Problem List   Diagnosis Date Noted  . Benign microscopic hematuria 11/05/2015  . Migraine without aura and without status migrainosus, not intractable 10/31/2015  . Memory loss 10/31/2015  . Knee pain, right 06/03/2013    Past Medical History:  Diagnosis Date  . Frequent headaches   . 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    Past Surgical History:  Procedure Laterality Date  . BACK SURGERY  12/08/2017   cervical disc   . CERVICAL DISC ARTHROPLASTY  2020  . ENDOMETRIAL ABLATION  2020  . TONSILLECTOMY  1997  . WISDOM TOOTH EXTRACTION      Social History   Tobacco Use  . Smoking status: Never Smoker  . Smokeless tobacco: Never Used  Substance Use Topics  . Alcohol use: No  . Drug use: No    Family History  Problem Relation Age of Onset  . Colon cancer Mother        Living  . Breast cancer Mother 14       65  . Colon cancer Maternal Grandfather   . Breast cancer Maternal Grandmother   . Heart disease Paternal Grandfather   . Hypertension Paternal Grandfather   . Healthy Father        Living  . Alzheimer's disease Paternal Grandmother   . Healthy Brother        x1  . Healthy Son        x2  . Allergies Son   . Healthy Daughter        x1  . Esophageal cancer Neg Hx   . Stomach cancer Neg Hx   . Rectal cancer Neg Hx     No Known Allergies  Medication list has been reviewed and updated.  No current outpatient medications on file prior to visit.   No current facility-administered medications on file  prior to visit.    Review of Systems:  As per HPI- otherwise negative.   Physical Examination: Vitals:   06/04/19 0817  BP: 135/86  Pulse: 73  Resp: 16  Temp: 98 F (36.7 C)  SpO2: 100%   Vitals:   06/04/19 0817  Weight: 165 lb (74.8 kg)  Height: 5\' 6"  (1.676 m)   Body mass index is 26.63 kg/m. Ideal Body Weight: Weight in (lb) to have BMI = 25: 154.6  GEN: no acute distress.  Minimal overweight, looks well  HEENT: Atraumatic, Normocephalic.  Ears and Nose: No external deformity. CV: RRR, No M/G/R. No JVD. No thrill. No extra heart sounds. PULM: CTA B, no wheezes, crackles, rhonchi. No retractions. No resp. distress. No accessory muscle use. ABD: S, NT, ND, +BS. No rebound. No HSM. EXTR: No c/c/e PSYCH: Normally interactive. Conversant.  Right knee shows normal range of motion, no effusion or crepitus Left ankle exam is normal, no point tenderness  or swelling   Assessment and Plan: Menorrhagia with regular cycle - Plan: CBC, Ferritin  Iron deficiency anemia due to chronic blood loss - Plan: CBC, Ferritin  Chronic pain of right knee  Chronic pain of left ankle  Patient underwent an endometrial ablation about 4 months ago for menorrhagia.  She has been somewhat disappointed that she continues to have bleeding, seems to get a little bit worse each month.  I encouraged her to follow-up with Dr. Helane Rima if her bleeding does not improve during the next several months We will check her iron and CBC today Discussed her knee and ankle pain.  Suspect that she has arthritis of the right knee.  She declines x-rays today I encouraged Tylenol as a first-line medication for joint pain I also encouraged her to use an ankle support when she is doing yard work or other more intense physical activity to help prevent an ankle sprain At this time she does not wish to see Ortho  This visit occurred during the SARS-CoV-2 public health emergency.  Safety protocols were in place, including screening questions prior to the visit, additional usage of staff PPE, and extensive cleaning of exam room while observing appropriate contact time as indicated for disinfecting solutions.   Moderate medical decision making today Signed Lamar Blinks, MD  Received her labs as below, message to pt  Results for orders placed or performed in visit on 06/04/19  CBC  Result Value Ref Range   WBC 5.1 4.0 - 10.5 K/uL   RBC 4.71 3.87 - 5.11 Mil/uL   Platelets 179.0 150.0 - 400.0 K/uL   Hemoglobin 12.8 12.0 - 15.0 g/dL   HCT 38.7 36.0 - 46.0 %   MCV 82.2 78.0 - 100.0 fl   MCHC 33.0 30.0 - 36.0 g/dL   RDW 16.9 (H) 11.5 - 15.5 %  Ferritin  Result Value Ref Range   Ferritin 7.0 (L) 10.0 - 291.0 ng/mL

## 2019-06-04 ENCOUNTER — Ambulatory Visit (INDEPENDENT_AMBULATORY_CARE_PROVIDER_SITE_OTHER): Payer: BC Managed Care – PPO | Admitting: Family Medicine

## 2019-06-04 ENCOUNTER — Other Ambulatory Visit: Payer: Self-pay

## 2019-06-04 ENCOUNTER — Encounter: Payer: Self-pay | Admitting: Family Medicine

## 2019-06-04 VITALS — BP 135/86 | HR 73 | Temp 98.0°F | Resp 16 | Ht 66.0 in | Wt 165.0 lb

## 2019-06-04 DIAGNOSIS — M25572 Pain in left ankle and joints of left foot: Secondary | ICD-10-CM

## 2019-06-04 DIAGNOSIS — N920 Excessive and frequent menstruation with regular cycle: Secondary | ICD-10-CM | POA: Diagnosis not present

## 2019-06-04 DIAGNOSIS — D500 Iron deficiency anemia secondary to blood loss (chronic): Secondary | ICD-10-CM | POA: Diagnosis not present

## 2019-06-04 DIAGNOSIS — M25561 Pain in right knee: Secondary | ICD-10-CM

## 2019-06-04 DIAGNOSIS — G8929 Other chronic pain: Secondary | ICD-10-CM

## 2019-06-04 LAB — CBC
HCT: 38.7 % (ref 36.0–46.0)
Hemoglobin: 12.8 g/dL (ref 12.0–15.0)
MCHC: 33 g/dL (ref 30.0–36.0)
MCV: 82.2 fl (ref 78.0–100.0)
Platelets: 179 10*3/uL (ref 150.0–400.0)
RBC: 4.71 Mil/uL (ref 3.87–5.11)
RDW: 16.9 % — ABNORMAL HIGH (ref 11.5–15.5)
WBC: 5.1 10*3/uL (ref 4.0–10.5)

## 2019-06-04 LAB — FERRITIN: Ferritin: 7 ng/mL — ABNORMAL LOW (ref 10.0–291.0)

## 2020-03-27 ENCOUNTER — Encounter: Payer: Self-pay | Admitting: Family Medicine

## 2020-08-05 IMAGING — CR DG CHEST 2V
2 series · 2 of 2 positions shown · non-contrast
Comparison: 05/14/2009

CLINICAL DATA: Cough for couple weeks.

EXAM:
CHEST - 2 VIEW

[w chest pa]
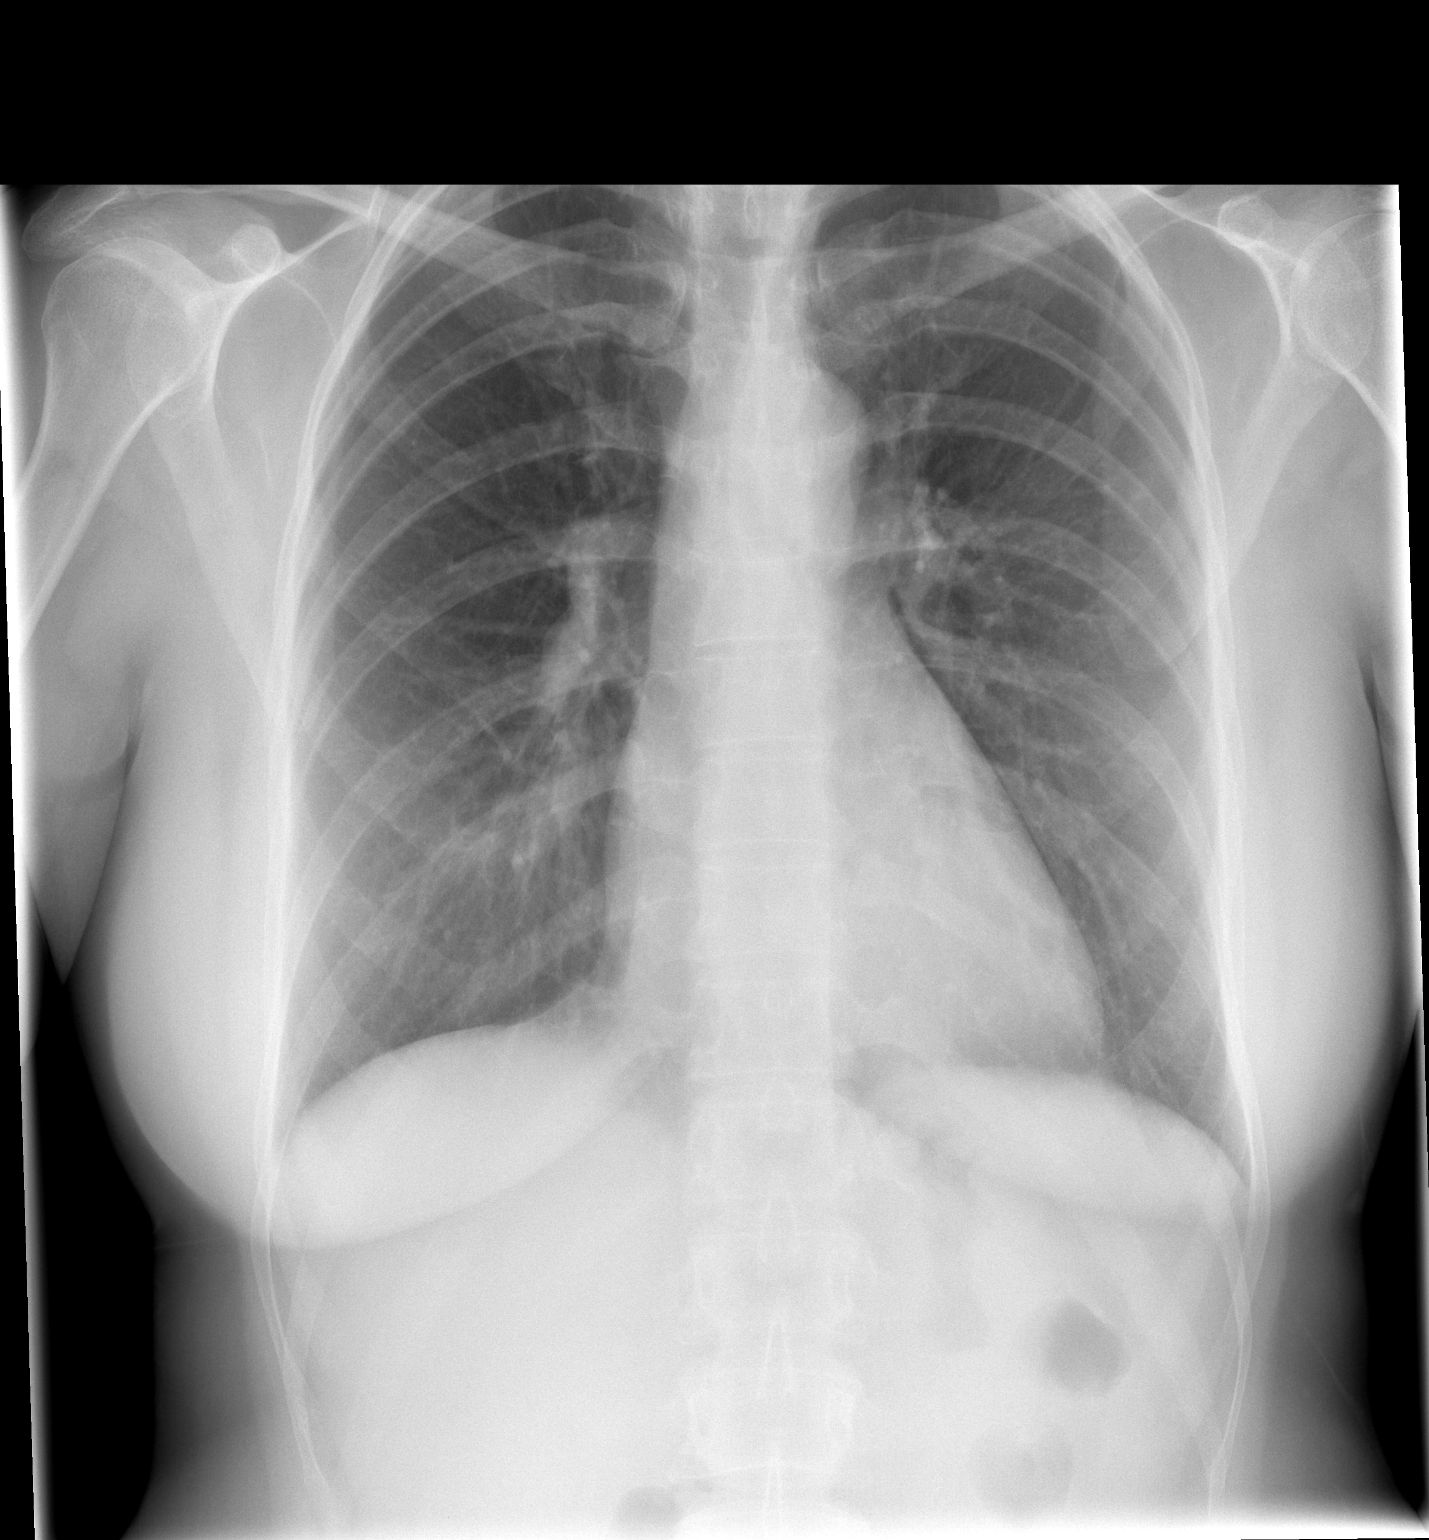

[w chest lat]
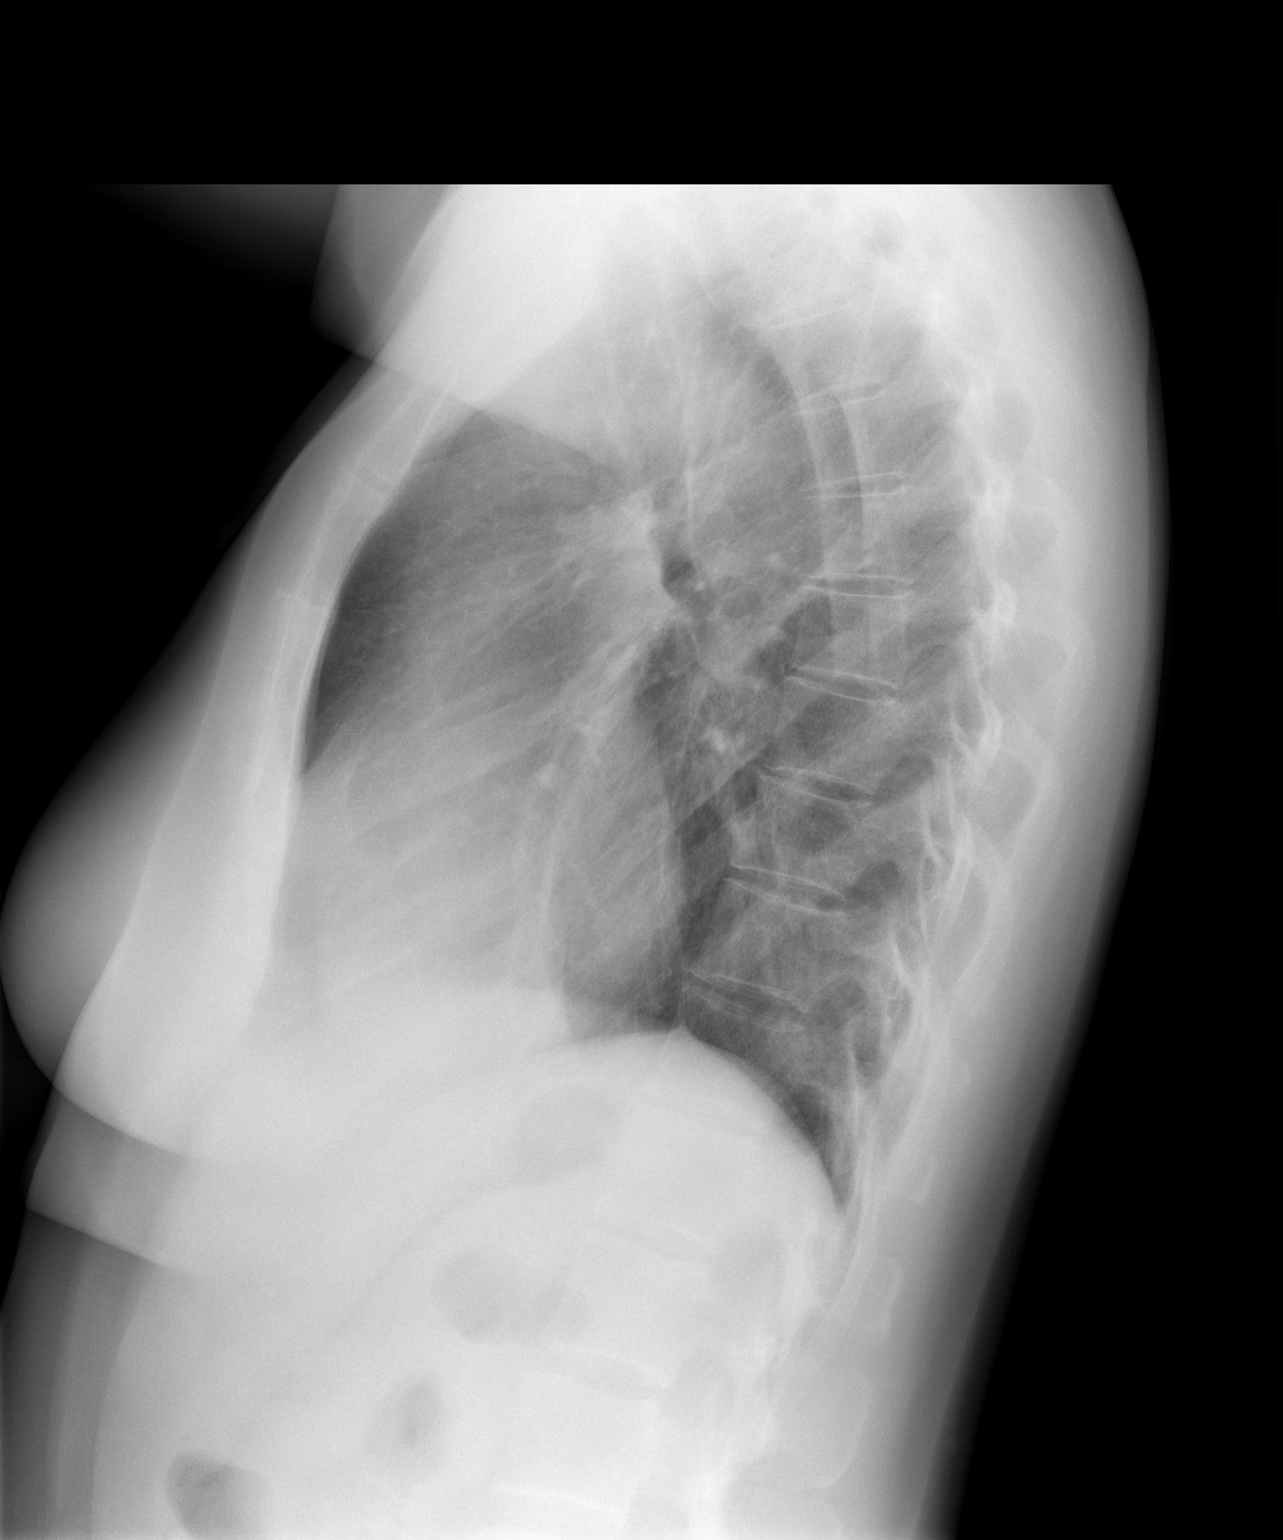

[2 of 2 positions shown; findings below may reference images not displayed]

FINDINGS: The heart size and mediastinal contours are within normal limits.
Both lungs are clear. The visualized skeletal structures are
unremarkable.
IMPRESSION: No active cardiopulmonary disease.

## 2021-01-01 IMAGING — MG DIGITAL DIAGNOSTIC UNILATERAL LEFT MAMMOGRAM WITH TOMO AND CAD
4 series · 4 of 12 positions shown · non-contrast
Comparison: Previous exam(s).

CLINICAL DATA: Recall from screening mammography with
tomosynthesis, possible asymmetry and subtle architectural
distortion involving the central LEFT breast visible only on the CC
images.

Family history of breast cancer in her mother at age 50 and in her
maternal grandmother.
EXAM:
DIGITAL DIAGNOSTIC UNILATERAL LEFT MAMMOGRAM WITH CAD AND TOMO

[L CC synth-2D]
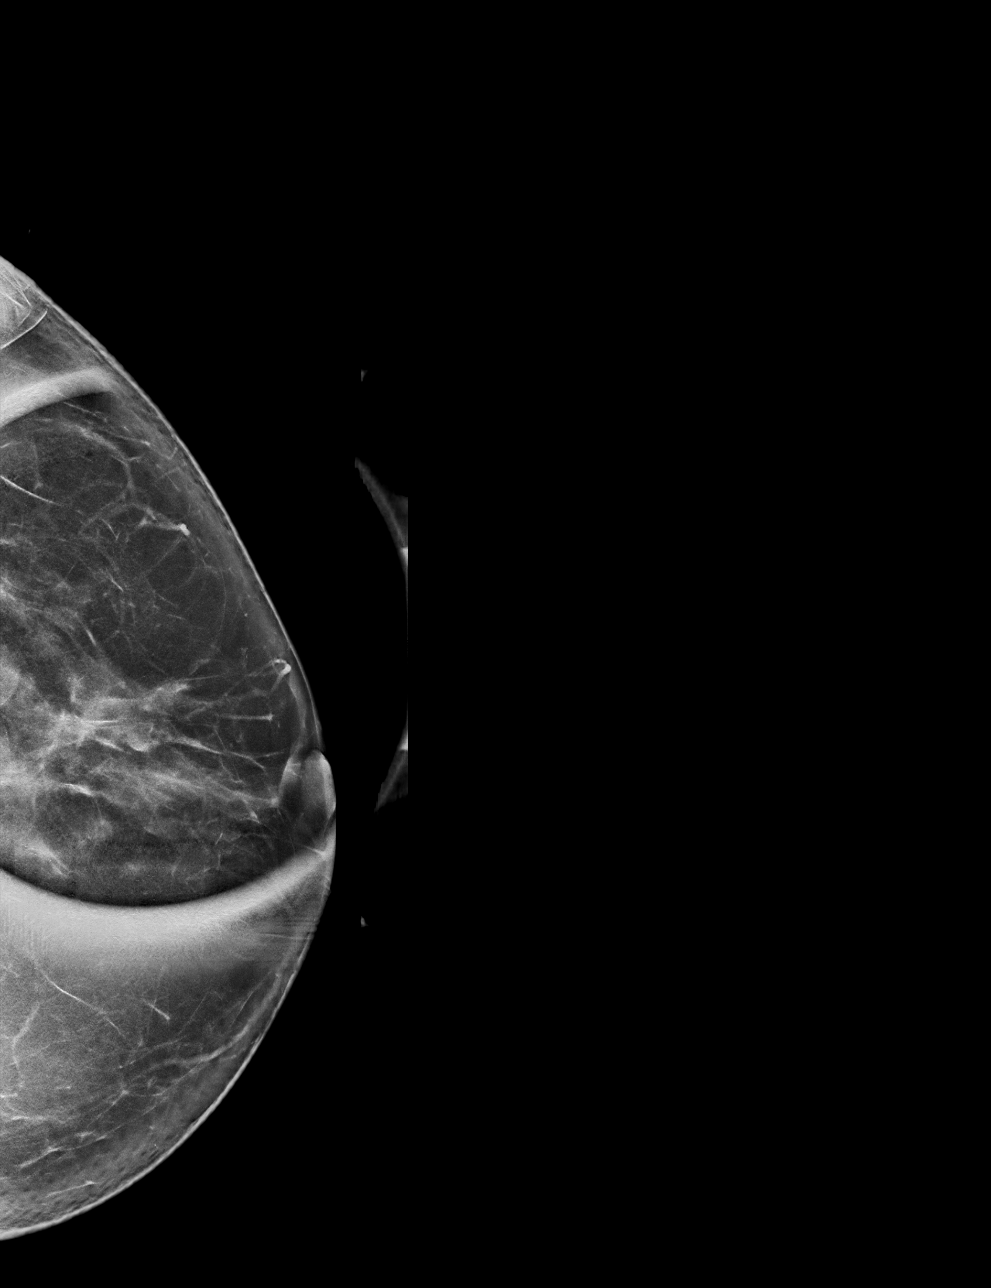

[L ML synth-2D]
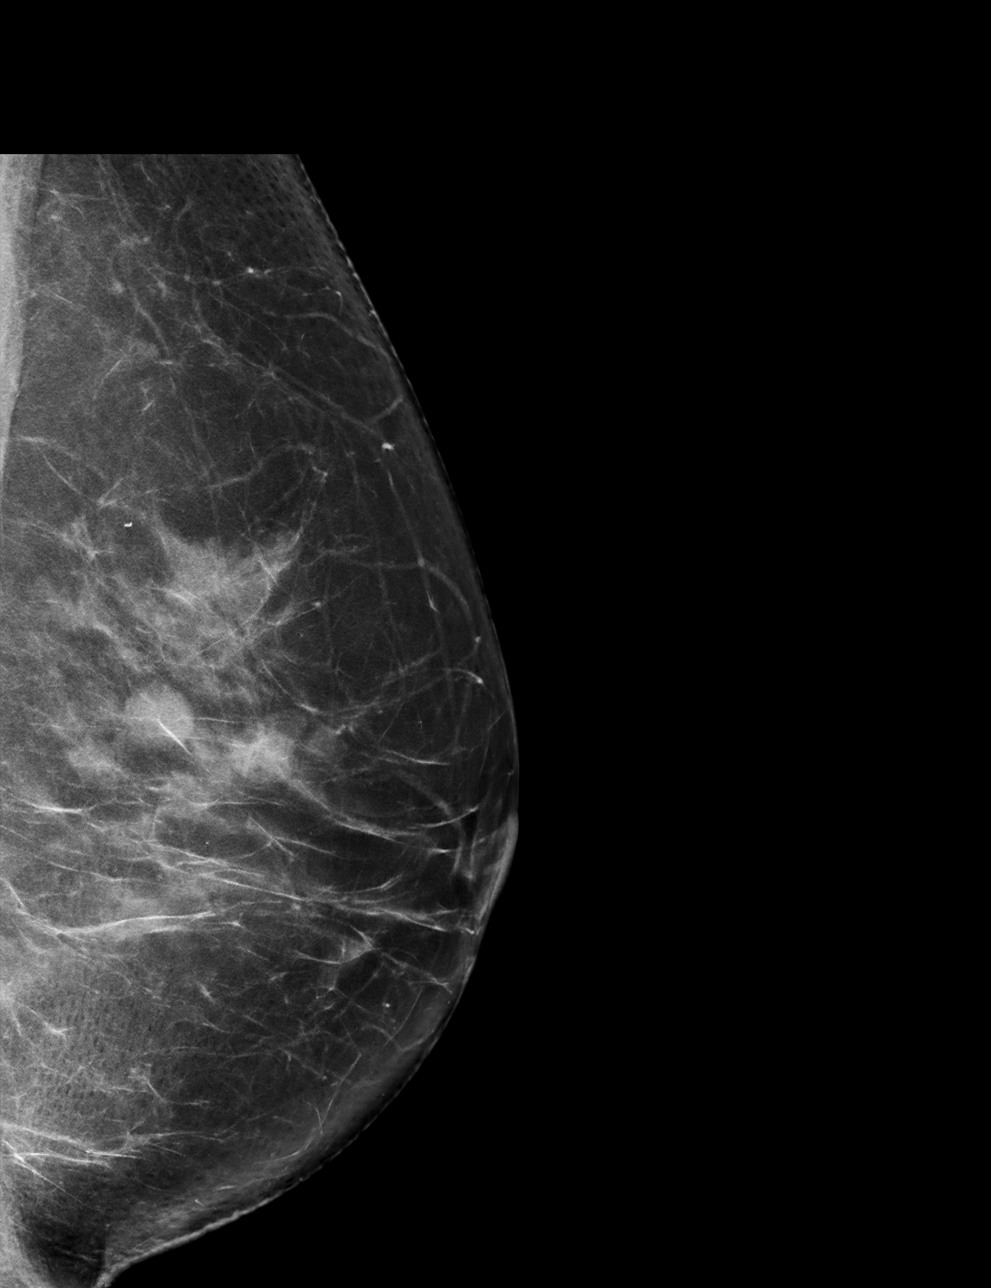

[L CC tomo · tomo slice 37/73.0]
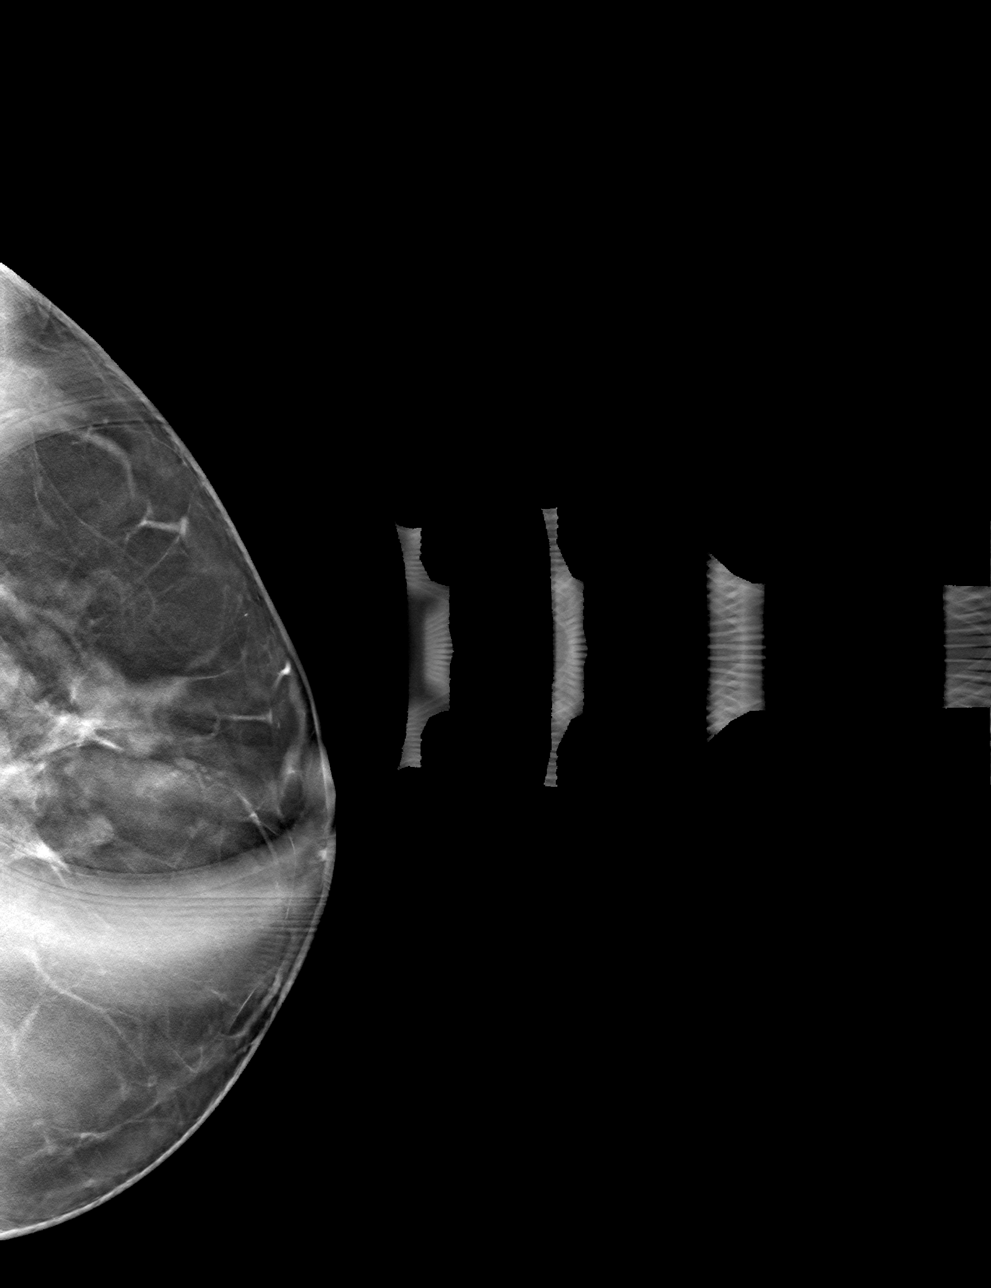

[L ML tomo · tomo slice 43/86.0]
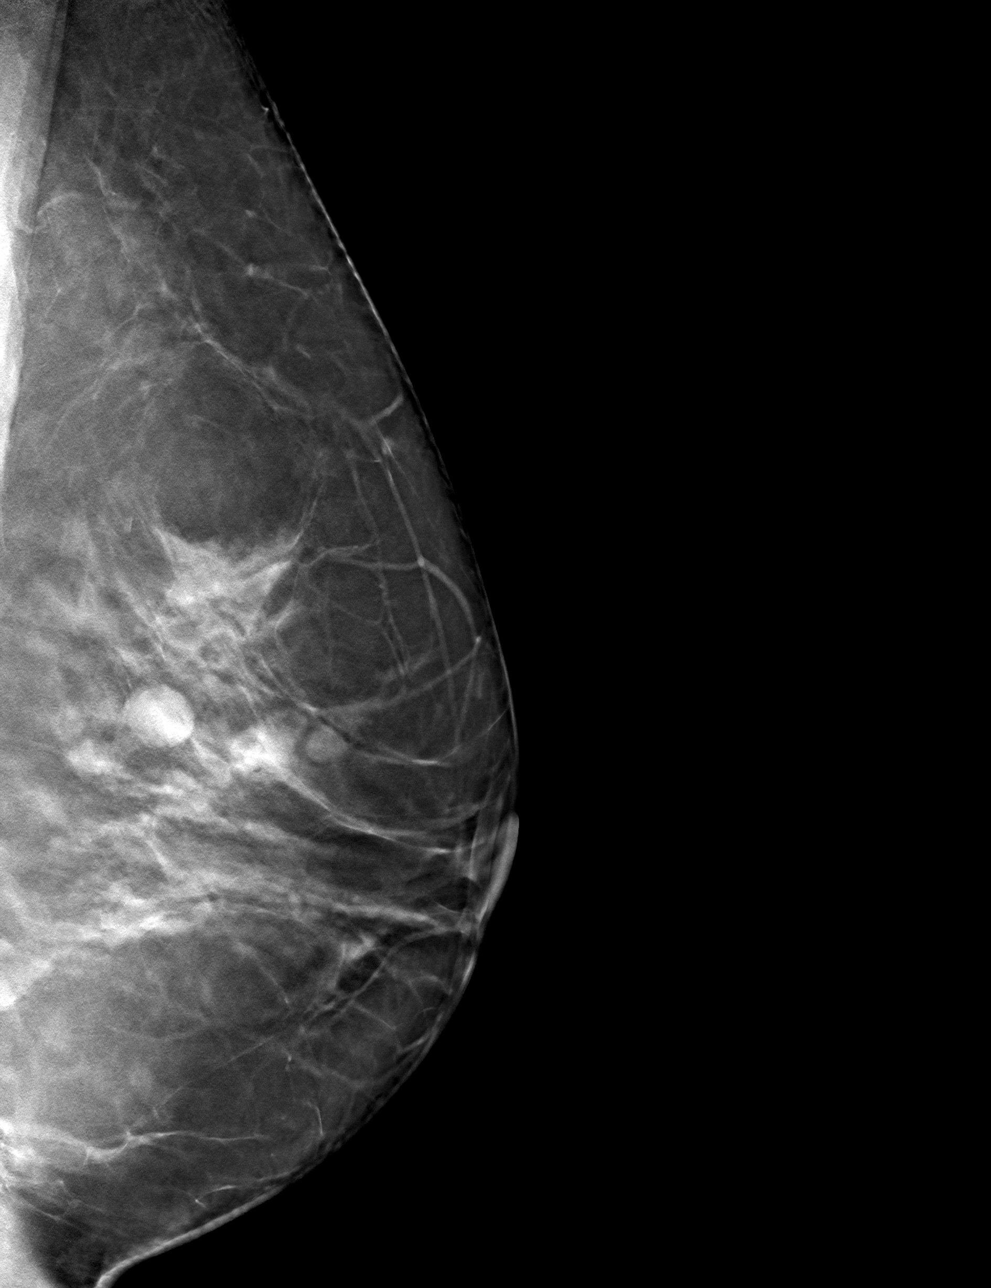

[4 of 12 positions shown; findings below may reference images not displayed]

ACR Breast Density Category c: The breast tissue is heterogeneously
dense, which may obscure small masses.
FINDINGS: Tomosynthesis and synthesized spot compression CC view of the area
of concern in the central LEFT breast and a tomosynthesis and
synthesized full field mediolateral view of the LEFT breast were
obtained.

The asymmetry questioned on screening mammography disperses with
compression. There is no persistent architectural distortion.
Circumscribed low-density masses in the UPPER OUTER LEFT breast were
shown on a prior ultrasound in 5506 to represent benign cysts.

No findings suspicious for malignancy in the LEFT breast.

Mammographic images were processed with CAD.
IMPRESSION: 1. No mammographic evidence of malignancy involving the LEFT breast.
2. Circumscribed masses in the UPPER OUTER LEFT breast shown on a
prior ultrasound to represent benign cysts.

RECOMMENDATION:
Screening mammogram in one year.(Code:[id number])

I have discussed the findings and recommendations with the patient.
Results were also provided in writing at the conclusion of the
visit. If applicable, a reminder letter will be sent to the patient
regarding the next appointment.

BI-RADS CATEGORY  2: Benign.

## 2021-02-07 NOTE — Patient Instructions (Addendum)
Good to see you today- I will be in touch with your labs  I suspect you may have a gallbladder issue.  Please stop by the imaging dept on the ground floor after your labs and see if we can get an ultrasound done today or in the next couple of days In the meantime try for a lower fat diet.  If any unusual pain please go to the ER!    I do recommend getting the bivalent covid vaccine at your pharmacy at your convenience

## 2021-02-07 NOTE — Progress Notes (Addendum)
[place] at [facility] [address], [place], [place] [address] [phone]  Date:  02/12/2021   Name:  Ruth Cooper   DOB:  05-06-1970   MRN:  [id number]  PCP:  Darreld Mclean, MD    Chief Complaint: Follow-up (Concerns/ questions: Discomfort under ribcage upper right hand side.x a couple of weeks./Flu shot: received in October.)   History of Present Illness:  Ruth Cooper is a 50 y.o. very pleasant female patient who presents with the following:  Pt seen today for follow-up Last seen by myself 4/21- she has 3 young adult children They are 58, 58 and 72 yo-all are doing well.  Her oldest just graduated from college, her middle child actually will graduate next semester.  Her youngest is a sophomore in high school  History of endometrial ablation for menorrhagia per Dr Helane Rima - last year  Her menses are much improved but not entirely gone, she still does bleed regularly for about 3 days each month  Hep C screening- do today  Covid booster- recommended  Shingrix- she is not sure if she had chicken pox, check titer today  Flu vaccine - done in late October  Pap is due - she declines today  Mammo 2/21- will order for the breast center  Labs are due for update - she is fasting today   She does a lot of yard work for exercise -no chest pain or shortness of breath with exertion  She has had a few episodes of abd pain that may last up to about 10 - 20 min Started about a year ago May occur once a month or twice a month May cause nausea No correlation to eating  Most often later in the day/ evening Occurs at rest.   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    Past Surgical History:  Procedure Laterality Date   BACK SURGERY  12/08/2017   cervical disc    CERVICAL DISC ARTHROPLASTY  2020   ENDOMETRIAL ABLATION  2020   TONSILLECTOMY  1997   WISDOM TOOTH EXTRACTION      Social History   Tobacco Use   Smoking status: Never   Smokeless tobacco: Never  Vaping Use   Vaping Use: Never used  Substance Use Topics   Alcohol use: No   Drug use: No    Family History  Problem Relation Age of Onset   Colon cancer Mother        Living   Breast cancer Mother 54       65   Colon cancer Maternal Grandfather    Breast cancer Maternal Grandmother    Heart disease Paternal Grandfather    Hypertension Paternal Grandfather    Healthy Father        Living   Alzheimer's disease Paternal Grandmother    Healthy Brother        x1   Healthy Son        x2   Allergies Son    Healthy Daughter        x1  Esophageal cancer Neg Hx    Stomach cancer Neg Hx    Rectal cancer Neg Hx     No Known Allergies  Medication list has been reviewed and updated.  Current Outpatient Medications on File Prior to Visit  Medication Sig Dispense Refill   aspirin 81 MG chewable tablet Chew by mouth daily.     Bioflavonoid Products (SUPER-C 1000 PO) Take by mouth.     No current facility-administered medications on file prior to visit.    Review of Systems:  As per HPI- otherwise negative.   Physical Examination: Vitals:   02/12/21 0831  BP: 110/72  Pulse: 96  Resp: 18  Temp: 98.2 F (36.8 C)  SpO2: 100%   Vitals:   02/12/21 0831  Weight: 161 lb 6.4 oz (73.2 kg)  Height: 5\' 6"  (1.676 m)   Body mass index is 26.05 kg/m. Ideal Body Weight: Weight in (lb) to have BMI = 25: 154.6  GEN: no acute distress.  Minimal overweight, looks well  HEENT: Atraumatic, Normocephalic.   Bilateral TM wnl, oropharynx normal.  PEERL,EOMI.   Ears and Nose: No  external deformity. CV: RRR, No M/G/R. No JVD. No thrill. No extra heart sounds. PULM: CTA B, no wheezes, crackles, rhonchi. No retractions. No resp. distress. No accessory muscle use. ABD: S, NT, ND, +BS. No rebound. No HSM. Belly is benign at this time, she is not currently experiencing pain EXTR: No c/c/e PSYCH: Normally interactive. Conversant.    Assessment and Plan: Physical exam  Iron deficiency anemia due to chronic blood loss - Plan: CBC, Ferritin  Encounter for hepatitis C screening test for low risk patient - Plan: Hepatitis C antibody  Fatigue, unspecified type - Plan: TSH, VITAMIN D 25 Hydroxy (Vit-D Deficiency, Fractures)  Screening for hyperlipidemia - Plan: Lipid panel  Screening for diabetes mellitus - Plan: Comprehensive metabolic panel, Hemoglobin A1c  Exposure to varicella - Plan: Varicella zoster antibody, IgG  Encounter for screening mammogram for malignant neoplasm of breast - Plan: MM 3D SCREEN BREAST BILATERAL  RUQ pain - Plan: US Abdomen Limited RUQ (LIVER/GB)  Patient seen today for physical exam Encouraged healthy diet and exercise routine Labs are pending as above She plans to do her Pap later on Varicella zoster titer, will determine if patient needs shingles vaccine Ordered mammogram  Patient has noted episodic epigastric abdominal pain, and some right upper quadrant discomfort for the last couple of weeks.  Current discomfort has been coming and going, not present now and not severe.  Suspicion for gallbladder disease, will obtain right upper quadrant ultrasound ASAP  Signed Lamar Blinks, MD  Received her ultrasound report as below, gave patient a call.  However, no answer and voicemail was full.  We will send her a MyChart message instead US Abdomen Limited RUQ (LIVER/GB)  Result Date: 02/12/2021 CLINICAL DATA:  Right upper quadrant pain EXAM: ULTRASOUND ABDOMEN LIMITED RIGHT UPPER QUADRANT COMPARISON:  None. FINDINGS: Gallbladder:  Echogenic shadowing gallstones noted, largest measures 0.2 cm. Normal wall thickness at 2 mm. No Murphy sign or pericholecystic fluid. No signs of cholecystitis. Gallbladder nondistended. Common bile duct: Diameter: 1.7 mm Liver: Slightly increased echogenicity may represent component of a hepatic steatosis. No large focal hepatic abnormality or intrahepatic biliary dilatation. No surrounding free fluid. Portal vein is patent on color Doppler imaging with normal direction of blood flow towards the liver. Other: Included views of the right kidney demonstrate no hydronephrosis. IMPRESSION: Cholelithiasis.  No other acute finding by ultrasound. Query minor hepatic  steatosis. Electronically Signed   By: Jerilynn Mages.  Shick M.D.   On: 02/12/2021 09:58    Received her labs as below, message to pt  Results for orders placed or performed in visit on 02/12/21  CBC  Result Value Ref Range   WBC 4.3 4.0 - 10.5 K/uL   RBC 4.98 3.87 - 5.11 Mil/uL   Platelets 154.0 150.0 - 400.0 K/uL   Hemoglobin 15.6 (H) 12.0 - 15.0 g/dL   HCT 46.1 (H) 36.0 - 46.0 %   MCV 92.7 78.0 - 100.0 fl   MCHC 33.8 30.0 - 36.0 g/dL   RDW 13.2 11.5 - 15.5 %  Comprehensive metabolic panel  Result Value Ref Range   Sodium 139 135 - 145 mEq/L   Potassium 4.3 3.5 - 5.1 mEq/L   Chloride 105 96 - 112 mEq/L   CO2 27 19 - 32 mEq/L   Glucose, Bld 85 70 - 99 mg/dL   BUN 15 6 - 23 mg/dL   Creatinine, Ser 0.70 0.40 - 1.20 mg/dL   Total Bilirubin 0.6 0.2 - 1.2 mg/dL   Alkaline Phosphatase 68 39 - 117 U/L   AST 15 0 - 37 U/L   ALT 9 0 - 35 U/L   Total Protein 7.2 6.0 - 8.3 g/dL   Albumin 4.7 3.5 - 5.2 g/dL   GFR 100.84 >60.00 mL/min   Calcium 9.5 8.4 - 10.5 mg/dL  Hemoglobin A1c  Result Value Ref Range   Hgb A1c MFr Bld 5.2 4.6 - 6.5 %  Lipid panel  Result Value Ref Range   Cholesterol 192 0 - 200 mg/dL   Triglycerides 48.0 0.0 - 149.0 mg/dL   HDL 59.60 >39.00 mg/dL   VLDL 9.6 0.0 - 40.0 mg/dL   LDL Cholesterol 123 (H) 0 - 99 mg/dL   Total  CHOL/HDL Ratio 3    NonHDL 132.27   TSH  Result Value Ref Range   TSH 1.01 0.35 - 5.50 uIU/mL  VITAMIN D 25 Hydroxy (Vit-D Deficiency, Fractures)  Result Value Ref Range   VITD 57.22 30.00 - 100.00 ng/mL  Ferritin  Result Value Ref Range   Ferritin 30.9 10.0 - 291.0 ng/mL

## 2021-02-12 ENCOUNTER — Ambulatory Visit (INDEPENDENT_AMBULATORY_CARE_PROVIDER_SITE_OTHER): Payer: BC Managed Care – PPO | Admitting: Family Medicine

## 2021-02-12 ENCOUNTER — Encounter: Payer: Self-pay | Admitting: Family Medicine

## 2021-02-12 ENCOUNTER — Other Ambulatory Visit: Payer: Self-pay

## 2021-02-12 ENCOUNTER — Ambulatory Visit (HOSPITAL_BASED_OUTPATIENT_CLINIC_OR_DEPARTMENT_OTHER)
Admission: RE | Admit: 2021-02-12 | Discharge: 2021-02-12 | Disposition: A | Discharge status: Home / Self Care | Payer: BC Managed Care – PPO | Source: Ambulatory Visit | Attending: Family Medicine | Admitting: Family Medicine

## 2021-02-12 VITALS — BP 110/72 | HR 96 | Temp 98.2°F | Resp 18 | Ht 66.0 in | Wt 161.4 lb

## 2021-02-12 DIAGNOSIS — Z1159 Encounter for screening for other viral diseases: Secondary | ICD-10-CM

## 2021-02-12 DIAGNOSIS — Z0000 Encounter for general adult medical examination without abnormal findings: Secondary | ICD-10-CM

## 2021-02-12 DIAGNOSIS — Z131 Encounter for screening for diabetes mellitus: Secondary | ICD-10-CM

## 2021-02-12 DIAGNOSIS — R1011 Right upper quadrant pain: Secondary | ICD-10-CM

## 2021-02-12 DIAGNOSIS — Z789 Other specified health status: Secondary | ICD-10-CM

## 2021-02-12 DIAGNOSIS — Z1231 Encounter for screening mammogram for malignant neoplasm of breast: Secondary | ICD-10-CM

## 2021-02-12 DIAGNOSIS — Z20820 Contact with and (suspected) exposure to varicella: Secondary | ICD-10-CM

## 2021-02-12 DIAGNOSIS — D500 Iron deficiency anemia secondary to blood loss (chronic): Secondary | ICD-10-CM | POA: Diagnosis not present

## 2021-02-12 DIAGNOSIS — D751 Secondary polycythemia: Secondary | ICD-10-CM

## 2021-02-12 DIAGNOSIS — Z13220 Encounter for screening for lipoid disorders: Secondary | ICD-10-CM

## 2021-02-12 DIAGNOSIS — R5383 Other fatigue: Secondary | ICD-10-CM | POA: Diagnosis not present

## 2021-02-12 DIAGNOSIS — K8020 Calculus of gallbladder without cholecystitis without obstruction: Secondary | ICD-10-CM

## 2021-02-12 LAB — CBC
HCT: 46.1 % — ABNORMAL HIGH (ref 36.0–46.0)
Hemoglobin: 15.6 g/dL — ABNORMAL HIGH (ref 12.0–15.0)
MCHC: 33.8 g/dL (ref 30.0–36.0)
MCV: 92.7 fl (ref 78.0–100.0)
Platelets: 154 10*3/uL (ref 150.0–400.0)
RBC: 4.98 Mil/uL (ref 3.87–5.11)
RDW: 13.2 % (ref 11.5–15.5)
WBC: 4.3 10*3/uL (ref 4.0–10.5)

## 2021-02-12 LAB — LIPID PANEL
Cholesterol: 192 mg/dL (ref 0–200)
HDL: 59.6 mg/dL (ref >39.00)
LDL Cholesterol: 123 mg/dL — ABNORMAL HIGH (ref 0–99)
NonHDL: 132.27
Total CHOL/HDL Ratio: 3
Triglycerides: 48 mg/dL (ref 0.0–149.0)
VLDL: 9.6 mg/dL (ref 0.0–40.0)

## 2021-02-12 LAB — FERRITIN: Ferritin: 30.9 ng/mL (ref 10.0–291.0)

## 2021-02-12 LAB — TSH: TSH: 1.01 u[IU]/mL (ref 0.35–5.50)

## 2021-02-12 LAB — COMPREHENSIVE METABOLIC PANEL
ALT: 9 U/L (ref 0–35)
AST: 15 U/L (ref 0–37)
Albumin: 4.7 g/dL (ref 3.5–5.2)
Alkaline Phosphatase: 68 U/L (ref 39–117)
BUN: 15 mg/dL (ref 6–23)
CO2: 27 mEq/L (ref 19–32)
Calcium: 9.5 mg/dL (ref 8.4–10.5)
Chloride: 105 mEq/L (ref 96–112)
Creatinine, Ser: 0.7 mg/dL (ref 0.40–1.20)
GFR: 100.84 mL/min (ref >60.00)
Glucose, Bld: 85 mg/dL (ref 70–99)
Potassium: 4.3 mEq/L (ref 3.5–5.1)
Sodium: 139 mEq/L (ref 135–145)
Total Bilirubin: 0.6 mg/dL (ref 0.2–1.2)
Total Protein: 7.2 g/dL (ref 6.0–8.3)

## 2021-02-12 LAB — HEMOGLOBIN A1C: Hgb A1c MFr Bld: 5.2 % (ref 4.6–6.5)

## 2021-02-12 LAB — VITAMIN D 25 HYDROXY (VIT D DEFICIENCY, FRACTURES): VITD: 57.22 ng/mL (ref 30.00–100.00)

## 2021-02-12 NOTE — Addendum Note (Signed)
Addended by: Lamar Blinks C on: 02/12/2021 08:16 PM   Modules accepted: Orders

## 2021-02-13 ENCOUNTER — Encounter: Payer: Self-pay | Admitting: Family Medicine

## 2021-02-13 LAB — HEPATITIS C ANTIBODY
Hepatitis C Ab: NONREACTIVE
SIGNAL TO CUT-OFF: 0.06 (ref <1.00)

## 2021-02-13 LAB — VARICELLA ZOSTER ANTIBODY, IGG: Varicella IgG: 604.9 index

## 2021-03-04 ENCOUNTER — Other Ambulatory Visit: Payer: Self-pay | Admitting: Family Medicine

## 2021-03-04 DIAGNOSIS — Z1231 Encounter for screening mammogram for malignant neoplasm of breast: Secondary | ICD-10-CM

## 2021-03-20 ENCOUNTER — Ambulatory Visit
Admission: RE | Admit: 2021-03-20 | Discharge: 2021-03-20 | Disposition: A | Discharge status: Home / Self Care | Payer: BC Managed Care – PPO | Source: Ambulatory Visit

## 2021-03-20 DIAGNOSIS — Z1231 Encounter for screening mammogram for malignant neoplasm of breast: Secondary | ICD-10-CM

## 2022-02-20 NOTE — Patient Instructions (Incomplete)
It was great to see you today, I will be in touch with your lab results ASAP Recommend latest COVID booster if not done already- and shingles series at your convenience Be sure to schedule your mammogram!  Continue to work on exercise It would be a good idea to have you see general surgery to discuss your gallbladder- let me know if you would like me to re-refer you

## 2022-02-20 NOTE — Progress Notes (Unsigned)
[place] at [facility] [address] [place], [place] [address] [phone]  Date:  02/24/2022   Name:  Ruth Cooper   DOB:  December 07, 1970   MRN:  [id number]  PCP:  Darreld Mclean, MD    Chief Complaint: No chief complaint on file.   History of Present Illness:  Ruth Cooper is a 51 y.o. very pleasant female patient who presents with the following:  Patient seen today for physical exam Most recent visit with myself about 1 year ago  She has 3 children ages 1, 44, 71  Her GYN is Dr.Grewal Her last visit she had possible symptoms of gallbladder colic-ultrasound was positive for gallstones  Flu shot Recommend latest COVID booster Pap smear Shingrix-she was not sure if she had chickenpox, last year we did a varicella titer which was positive Can update lab work today Mammogram was completed in January Colonoscopy 2021 Patient Active Problem List   Diagnosis Date Noted   Benign microscopic hematuria 11/05/2015   Migraine without aura and without status migrainosus, not intractable 10/31/2015   Memory loss 10/31/2015   Knee pain, right 06/03/2013    Past Medical History:  Diagnosis Date   Frequent headaches    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    Past Surgical History:  Procedure Laterality Date   BACK SURGERY  12/08/2017   cervical disc    CERVICAL DISC ARTHROPLASTY  2020   ENDOMETRIAL ABLATION  2020   TONSILLECTOMY  1997   WISDOM TOOTH EXTRACTION      Social History   Tobacco Use   Smoking status: Never   Smokeless tobacco: Never  Vaping Use   Vaping Use: Never used  Substance Use Topics   Alcohol use: No   Drug use: No    Family History  Problem Relation Age of Onset   Colon cancer Mother        Living   Breast cancer Mother 27       again @ 25 - BrCA +   Healthy Father        Living   Healthy Daughter        x1    Breast cancer Maternal Grandmother 28   Colon cancer Maternal Grandfather    Alzheimer's disease Paternal Grandmother    Heart disease Paternal Grandfather    Hypertension Paternal Grandfather    Healthy Brother        x1   Healthy Son        x2   Allergies Son    Esophageal cancer Neg Hx    Stomach cancer Neg Hx    Rectal cancer Neg Hx     No Known Allergies  Medication list has been reviewed and updated.  Current Outpatient Medications on File Prior to Visit  Medication Sig Dispense Refill   aspirin 81 MG chewable tablet Chew by mouth daily.     Bioflavonoid Products (SUPER-C 1000 PO) Take by mouth.     No current facility-administered medications on file prior to visit.    Review of Systems:  As per HPI- otherwise negative.   Physical Examination: There were no vitals filed for this visit. There were no vitals filed for this visit. There is no height or weight on file to calculate BMI. Ideal Body Weight:    GEN: no acute distress. HEENT: Atraumatic, Normocephalic.  Ears and Nose: No  external deformity. CV: RRR, No M/G/R. No JVD. No thrill. No extra heart sounds. PULM: CTA B, no wheezes, crackles, rhonchi. No retractions. No resp. distress. No accessory muscle use. ABD: S, NT, ND, +BS. No rebound. No HSM. EXTR: No c/c/e PSYCH: Normally interactive. Conversant.    Assessment and Plan: *** Physical exam today.  Encouraged healthy diet and exercise routine Signed Lamar Blinks, MD

## 2022-02-24 ENCOUNTER — Other Ambulatory Visit (HOSPITAL_COMMUNITY)
Admission: RE | Admit: 2022-02-24 | Discharge: 2022-02-24 | Disposition: A | Discharge status: Home / Self Care | Payer: BC Managed Care – PPO | Source: Ambulatory Visit | Attending: Family Medicine | Admitting: Family Medicine

## 2022-02-24 ENCOUNTER — Encounter: Payer: Self-pay | Admitting: Family Medicine

## 2022-02-24 ENCOUNTER — Ambulatory Visit (INDEPENDENT_AMBULATORY_CARE_PROVIDER_SITE_OTHER): Payer: BC Managed Care – PPO | Admitting: Family Medicine

## 2022-02-24 VITALS — BP 122/80 | HR 84 | Temp 97.7°F | Resp 18 | Ht 66.0 in | Wt 162.0 lb

## 2022-02-24 DIAGNOSIS — Z0000 Encounter for general adult medical examination without abnormal findings: Secondary | ICD-10-CM

## 2022-02-24 DIAGNOSIS — N926 Irregular menstruation, unspecified: Secondary | ICD-10-CM | POA: Diagnosis not present

## 2022-02-24 DIAGNOSIS — R5383 Other fatigue: Secondary | ICD-10-CM

## 2022-02-24 DIAGNOSIS — Z13220 Encounter for screening for lipoid disorders: Secondary | ICD-10-CM | POA: Diagnosis not present

## 2022-02-24 DIAGNOSIS — D500 Iron deficiency anemia secondary to blood loss (chronic): Secondary | ICD-10-CM | POA: Diagnosis not present

## 2022-02-24 DIAGNOSIS — Z131 Encounter for screening for diabetes mellitus: Secondary | ICD-10-CM | POA: Diagnosis not present

## 2022-02-24 DIAGNOSIS — Z124 Encounter for screening for malignant neoplasm of cervix: Secondary | ICD-10-CM | POA: Diagnosis present

## 2022-02-24 LAB — CBC
HCT: 45.5 % (ref 36.0–46.0)
Hemoglobin: 15.5 g/dL — ABNORMAL HIGH (ref 12.0–15.0)
MCHC: 34.2 g/dL (ref 30.0–36.0)
MCV: 92.3 fl (ref 78.0–100.0)
Platelets: 169 10*3/uL (ref 150.0–400.0)
RBC: 4.93 Mil/uL (ref 3.87–5.11)
RDW: 13.1 % (ref 11.5–15.5)
WBC: 4.1 10*3/uL (ref 4.0–10.5)

## 2022-02-24 LAB — COMPREHENSIVE METABOLIC PANEL
ALT: 12 U/L (ref 0–35)
AST: 17 U/L (ref 0–37)
Albumin: 4.6 g/dL (ref 3.5–5.2)
Alkaline Phosphatase: 63 U/L (ref 39–117)
BUN: 12 mg/dL (ref 6–23)
CO2: 28 mEq/L (ref 19–32)
Calcium: 9.1 mg/dL (ref 8.4–10.5)
Chloride: 104 mEq/L (ref 96–112)
Creatinine, Ser: 0.69 mg/dL (ref 0.40–1.20)
GFR: 100.46 mL/min (ref >60.00)
Glucose, Bld: 90 mg/dL (ref 70–99)
Potassium: 4 mEq/L (ref 3.5–5.1)
Sodium: 139 mEq/L (ref 135–145)
Total Bilirubin: 0.7 mg/dL (ref 0.2–1.2)
Total Protein: 7.2 g/dL (ref 6.0–8.3)

## 2022-02-24 LAB — HEMOGLOBIN A1C: Hgb A1c MFr Bld: 5.2 % (ref 4.6–6.5)

## 2022-02-24 LAB — VITAMIN D 25 HYDROXY (VIT D DEFICIENCY, FRACTURES): VITD: 49.77 ng/mL (ref 30.00–100.00)

## 2022-02-24 LAB — LIPID PANEL
Cholesterol: 186 mg/dL (ref 0–200)
HDL: 59.5 mg/dL (ref >39.00)
LDL Cholesterol: 109 mg/dL — ABNORMAL HIGH (ref 0–99)
NonHDL: 126.12
Total CHOL/HDL Ratio: 3
Triglycerides: 86 mg/dL (ref 0.0–149.0)
VLDL: 17.2 mg/dL (ref 0.0–40.0)

## 2022-02-24 LAB — FOLLICLE STIMULATING HORMONE: FSH: 73.1 m[IU]/mL

## 2022-02-24 LAB — TSH: TSH: 1 u[IU]/mL (ref 0.35–5.50)

## 2022-02-25 ENCOUNTER — Encounter: Payer: Self-pay | Admitting: Family Medicine

## 2022-02-25 LAB — CYTOLOGY - PAP
Comment: NEGATIVE
Diagnosis: NEGATIVE
High risk HPV: NEGATIVE

## 2022-02-26 ENCOUNTER — Other Ambulatory Visit: Payer: Self-pay | Admitting: Family Medicine

## 2022-02-26 DIAGNOSIS — Z1231 Encounter for screening mammogram for malignant neoplasm of breast: Secondary | ICD-10-CM

## 2022-04-23 ENCOUNTER — Ambulatory Visit
Admission: RE | Admit: 2022-04-23 | Discharge: 2022-04-23 | Disposition: A | Discharge status: Home / Self Care | Payer: BC Managed Care – PPO | Source: Ambulatory Visit | Attending: Family Medicine | Admitting: Family Medicine

## 2022-04-23 DIAGNOSIS — Z1231 Encounter for screening mammogram for malignant neoplasm of breast: Secondary | ICD-10-CM

## 2022-04-28 ENCOUNTER — Encounter: Payer: Self-pay | Admitting: Family Medicine

## 2023-03-24 ENCOUNTER — Ambulatory Visit: Payer: Self-pay | Admitting: Family Medicine

## 2023-03-24 ENCOUNTER — Ambulatory Visit (INDEPENDENT_AMBULATORY_CARE_PROVIDER_SITE_OTHER): Payer: BC Managed Care – PPO | Admitting: Physician Assistant

## 2023-03-24 VITALS — BP 162/100 | HR 99 | Temp 98.5°F | Ht 66.0 in | Wt 171.1 lb

## 2023-03-24 DIAGNOSIS — R030 Elevated blood-pressure reading, without diagnosis of hypertension: Secondary | ICD-10-CM | POA: Diagnosis not present

## 2023-03-24 DIAGNOSIS — R519 Headache, unspecified: Secondary | ICD-10-CM | POA: Insufficient documentation

## 2023-03-24 DIAGNOSIS — I10 Essential (primary) hypertension: Secondary | ICD-10-CM | POA: Insufficient documentation

## 2023-03-24 LAB — COMPREHENSIVE METABOLIC PANEL
ALT: 15 U/L (ref 0–35)
AST: 21 U/L (ref 0–37)
Albumin: 4.8 g/dL (ref 3.5–5.2)
Alkaline Phosphatase: 82 U/L (ref 39–117)
BUN: 17 mg/dL (ref 6–23)
CO2: 29 meq/L (ref 19–32)
Calcium: 9.6 mg/dL (ref 8.4–10.5)
Chloride: 103 meq/L (ref 96–112)
Creatinine, Ser: 0.75 mg/dL (ref 0.40–1.20)
GFR: 91.46 mL/min (ref >60.00)
Glucose, Bld: 101 mg/dL — ABNORMAL HIGH (ref 70–99)
Potassium: 4 meq/L (ref 3.5–5.1)
Sodium: 140 meq/L (ref 135–145)
Total Bilirubin: 0.6 mg/dL (ref 0.2–1.2)
Total Protein: 7.3 g/dL (ref 6.0–8.3)

## 2023-03-24 NOTE — Assessment & Plan Note (Signed)
BP in office, which was repeated to confirm, is elevated similarly to the values she was getting at home. Her last visit w/ [place] was 02/24/2022, and the only other BP comparison was 04/2022 during her D&C, elevated to 140/90.  Discussed long-term risks of untreated hypertension (heart disease, stroke) and the importance of monitoring blood pressure. Explained hypertensive emergencies and warning signs (thunderclap headaches, sudden blurred vision).  Given pt's hesitance  to agree this is high blood pressure, and reluctance to start meds-- advised her to check bp at home, twice daily from now until her appointment this upcoming Monday with her pcp.  Will get cmp today.

## 2023-03-24 NOTE — Telephone Encounter (Signed)
Copied from CRM [id number]. Topic: Clinical - Red Word Triage >> Mar 24, 2023  7:34 AM Marica Otter wrote: Red Word that prompted transfer to Nurse Triage: Patient states her blood pressure 165/100 and having headaches.  Chief Complaint: high BP Symptoms: BP 169/104 this morning,  165/102 @ 0720,  177/107 @0745 , headache Frequency: today  Pertinent Negatives: Patient denies blurred vision Disposition: [] ED /[] Urgent Care (no appt availability in office) / [x] Appointment(In office/virtual)/ []  [place] [facility]/ [] Home Care/ [] Refused Recommended Disposition /[] [facility]/ []  Follow-up with PCP Additional Notes: pt has been having headaches for at least a week and has not checked BP until today.  Reason for Disposition  Systolic BP  >= 160 OR Diastolic >= 100  Answer Assessment - Initial Assessment Questions 1. BLOOD PRESSURE: "What is the blood pressure?" "Did you take at least two measurements 5 minutes apart?"     N/a 2. ONSET: "When did you take your blood pressure?"     Today 169/104 early this morning when woke up 165/102 7:30 am 3. HOW: "How did you take your blood pressure?" (e.g., automatic home BP monitor, visiting nurse)     automatic 4. HISTORY: "Do you have a history of high blood pressure?"     Have had readings before of high BP 5. MEDICINES: "Are you taking any medicines for blood pressure?" "Have you missed any doses recently?"     no 6. OTHER SYMPTOMS: "Do you have any symptoms?" (e.g., blurred vision, chest pain, difficulty breathing, headache, weakness)     Headache started about 1 week ago, face feels numb or tight - has happened before 7. PREGNANCY: "Is there any chance you are pregnant?" "When was your last menstrual period?"     N/a  Protocols used: Blood Pressure - High-A-AH
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
## 2023-03-24 NOTE — Assessment & Plan Note (Signed)
Pt recalls different than historic migraines, and different from headaches 2/2 to bending over and coughing.  History of frequent use of over-the-counter medications (BC powder, Tylenol, Advil).   Discussed risks of frequent use of over-the-counter pain medications. Will check cmp today. May be 2/2 to untreated HTN

## 2023-03-26 NOTE — Progress Notes (Unsigned)
[facility] [address] [place], [place] [address] [phone]  Date:  03/28/2023   Name:  Cleola Perryman   DOB:  November 20, 1970   MRN:  [id number]  PCP:  Pearline Cables, MD    Chief Complaint: No chief complaint on file.   History of Present Illness:  Lakeishia Truluck is a 53 y.o. very pleasant female patient who presents with the following:  Patient seen today for physical exam.  Most recent visit with myself was just over a year ago She is generally in good health, children ages 21, 52, 75  She was seen by my partner Alfredia Ferguson, PA-C last week with concern of an unusually high blood pressure reading, 167/102.  Elevated blood pressure was confirmed in the office, 162/100  BP Readings from Last 3 Encounters:  03/24/23 (!) 162/100  02/24/22 122/80  02/12/21 110/72   Normally patient's blood pressure is within normal limits, they did not make any medication changes at that time but instead we planned this follow-up visit, CMP was done  Secondary Shingrix COVID booster up-to-date Flu shot up-to-date Mammogram up-to-date Pap smear completed last year Colon cancer screening up-to-date Patient Active Problem List   Diagnosis Date Noted   Elevated blood pressure reading in office without diagnosis of hypertension 03/24/2023   Frequent headaches 03/24/2023   Benign microscopic hematuria 11/05/2015   Migraine without aura and without status migrainosus, not intractable 10/31/2015   Memory loss 10/31/2015   Knee pain, right 06/03/2013    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    Past Surgical History:  Procedure Laterality Date   BACK SURGERY  12/08/2017   cervical disc    CERVICAL DISC ARTHROPLASTY  2020   ENDOMETRIAL ABLATION  2020   TONSILLECTOMY  1997   WISDOM TOOTH  EXTRACTION      Social History   Tobacco Use   Smoking status: Never   Smokeless tobacco: Never  Vaping Use   Vaping status: Never Used  Substance Use Topics   Alcohol use: No   Drug use: No    Family History  Problem Relation Age of Onset   Colon cancer Mother        Living   Breast cancer Mother 63       again @ 53 - BrCA +   Healthy Father        Living   Healthy Daughter        x1   Breast cancer Maternal Grandmother 66   Colon cancer Maternal Grandfather    Alzheimer's disease Paternal Grandmother    Heart disease Paternal Grandfather    Hypertension Paternal Grandfather    Healthy Brother        x1   Healthy Son        x2   Allergies Son    Esophageal cancer Neg Hx    Stomach cancer Neg Hx    Rectal cancer Neg Hx     No Known Allergies  Medication list has been reviewed and updated.  No current outpatient medications on file prior to visit.   No current facility-administered medications on file prior to visit.    Review of Systems:  As per HPI- otherwise negative.   Physical Examination: There were no vitals filed for this visit. There were  no vitals filed for this visit. There is no height or weight on file to calculate BMI. Ideal Body Weight:    GEN: no acute distress. HEENT: Atraumatic, Normocephalic.  Ears and Nose: No external deformity. CV: RRR, No M/G/R. No JVD. No thrill. No extra heart sounds. PULM: CTA B, no wheezes, crackles, rhonchi. No retractions. No resp. distress. No accessory muscle use. ABD: S, NT, ND, +BS. No rebound. No HSM. EXTR: No c/c/e PSYCH: Normally interactive. Conversant.    Assessment and Plan: *** Physical exam today.  Encouraged healthy diet and exercise routine Signed Abbe Amsterdam, MD

## 2023-03-26 NOTE — Patient Instructions (Signed)
It was good to see you today, I will be in touch with your lab work You likely have a bit of hypertension (high blood pressure) which is showing itself now, perhaps due to menopausal changes  We will get she started losartan 25 mg, take this once a day.  If you would, please keep an eye on your blood pressure and let me know how it looks over the next 10 days or so  I will also set you up for an echocardiogram to get a more detailed look at your heart muscle

## 2023-03-28 ENCOUNTER — Encounter: Payer: Self-pay | Admitting: Family Medicine

## 2023-03-28 ENCOUNTER — Ambulatory Visit (INDEPENDENT_AMBULATORY_CARE_PROVIDER_SITE_OTHER): Payer: BC Managed Care – PPO | Admitting: Family Medicine

## 2023-03-28 VITALS — BP 148/88 | HR 88 | Temp 97.7°F | Resp 18 | Ht 66.0 in | Wt 172.6 lb

## 2023-03-28 DIAGNOSIS — Z1329 Encounter for screening for other suspected endocrine disorder: Secondary | ICD-10-CM

## 2023-03-28 DIAGNOSIS — D500 Iron deficiency anemia secondary to blood loss (chronic): Secondary | ICD-10-CM

## 2023-03-28 DIAGNOSIS — Z131 Encounter for screening for diabetes mellitus: Secondary | ICD-10-CM

## 2023-03-28 DIAGNOSIS — Z0000 Encounter for general adult medical examination without abnormal findings: Secondary | ICD-10-CM | POA: Diagnosis not present

## 2023-03-28 DIAGNOSIS — Z13220 Encounter for screening for lipoid disorders: Secondary | ICD-10-CM | POA: Diagnosis not present

## 2023-03-28 DIAGNOSIS — I10 Essential (primary) hypertension: Secondary | ICD-10-CM

## 2023-03-28 DIAGNOSIS — R9431 Abnormal electrocardiogram [ECG] [EKG]: Secondary | ICD-10-CM

## 2023-03-28 DIAGNOSIS — D751 Secondary polycythemia: Secondary | ICD-10-CM

## 2023-03-28 LAB — CBC
HCT: 47.6 % — ABNORMAL HIGH (ref 36.0–46.0)
Hemoglobin: 15.9 g/dL — ABNORMAL HIGH (ref 12.0–15.0)
MCHC: 33.4 g/dL (ref 30.0–36.0)
MCV: 92.3 fL (ref 78.0–100.0)
Platelets: 162 10*3/uL (ref 150.0–400.0)
RBC: 5.16 Mil/uL — ABNORMAL HIGH (ref 3.87–5.11)
RDW: 12.9 % (ref 11.5–15.5)
WBC: 4.7 10*3/uL (ref 4.0–10.5)

## 2023-03-28 LAB — LIPID PANEL
Cholesterol: 211 mg/dL — ABNORMAL HIGH (ref 0–200)
HDL: 68.3 mg/dL (ref >39.00)
LDL Cholesterol: 122 mg/dL — ABNORMAL HIGH (ref 0–99)
NonHDL: 142.33
Total CHOL/HDL Ratio: 3
Triglycerides: 100 mg/dL (ref 0.0–149.0)
VLDL: 20 mg/dL (ref 0.0–40.0)

## 2023-03-28 LAB — HEMOGLOBIN A1C: Hgb A1c MFr Bld: 5.5 % (ref 4.6–6.5)

## 2023-03-28 LAB — TROPONIN I (HIGH SENSITIVITY): High Sens Troponin I: 5 ng/L (ref 2–17)

## 2023-03-28 LAB — TSH: TSH: 0.92 u[IU]/mL (ref 0.35–5.50)

## 2023-03-28 MED ORDER — LOSARTAN POTASSIUM 25 MG PO TABS: 25.0000 mg | ORAL_TABLET | Freq: Every day | ORAL | 3 refills | Status: DC

## 2023-04-06 ENCOUNTER — Other Ambulatory Visit (INDEPENDENT_AMBULATORY_CARE_PROVIDER_SITE_OTHER): Payer: BC Managed Care – PPO

## 2023-04-06 DIAGNOSIS — D751 Secondary polycythemia: Secondary | ICD-10-CM | POA: Diagnosis not present

## 2023-04-06 LAB — CBC
HCT: 46.3 % — ABNORMAL HIGH (ref 36.0–46.0)
Hemoglobin: 15.5 g/dL — ABNORMAL HIGH (ref 12.0–15.0)
MCHC: 33.4 g/dL (ref 30.0–36.0)
MCV: 93 fL (ref 78.0–100.0)
Platelets: 141 10*3/uL — ABNORMAL LOW (ref 150.0–400.0)
RBC: 4.97 Mil/uL (ref 3.87–5.11)
RDW: 13.3 % (ref 11.5–15.5)
WBC: 4.7 10*3/uL (ref 4.0–10.5)

## 2023-04-06 LAB — FERRITIN: Ferritin: 59.1 ng/mL (ref 10.0–291.0)

## 2023-04-07 ENCOUNTER — Encounter: Payer: Self-pay | Admitting: Family Medicine

## 2023-04-15 ENCOUNTER — Encounter: Payer: Self-pay | Admitting: Family Medicine

## 2023-04-15 ENCOUNTER — Ambulatory Visit (HOSPITAL_COMMUNITY): Payer: BC Managed Care – PPO | Attending: Family Medicine

## 2023-04-15 DIAGNOSIS — I10 Essential (primary) hypertension: Secondary | ICD-10-CM | POA: Insufficient documentation

## 2023-04-15 DIAGNOSIS — I77810 Thoracic aortic ectasia: Secondary | ICD-10-CM | POA: Insufficient documentation

## 2023-04-15 DIAGNOSIS — R9431 Abnormal electrocardiogram [ECG] [EKG]: Secondary | ICD-10-CM | POA: Diagnosis present

## 2023-04-15 LAB — ECHOCARDIOGRAM COMPLETE
Area-P 1/2: 4.68 cm2
S' Lateral: 2.5 cm

## 2023-04-15 LAB — HEMOCHROMATOSIS DNA-PCR(C282Y,H63D)

## 2023-04-19 ENCOUNTER — Encounter: Payer: Self-pay | Admitting: Family Medicine

## 2023-05-31 ENCOUNTER — Other Ambulatory Visit: Payer: Self-pay | Admitting: Family Medicine

## 2023-05-31 DIAGNOSIS — Z1231 Encounter for screening mammogram for malignant neoplasm of breast: Secondary | ICD-10-CM

## 2023-06-09 ENCOUNTER — Ambulatory Visit
Admission: RE | Admit: 2023-06-09 | Discharge: 2023-06-09 | Disposition: A | Discharge status: Home / Self Care | Source: Ambulatory Visit | Attending: Family Medicine | Admitting: Family Medicine

## 2023-06-09 DIAGNOSIS — Z1231 Encounter for screening mammogram for malignant neoplasm of breast: Secondary | ICD-10-CM

## 2023-06-10 ENCOUNTER — Ambulatory Visit

## 2023-07-21 ENCOUNTER — Other Ambulatory Visit: Payer: Self-pay | Admitting: Family Medicine

## 2023-07-21 DIAGNOSIS — I10 Essential (primary) hypertension: Secondary | ICD-10-CM

## 2023-09-07 ENCOUNTER — Encounter: Payer: Self-pay | Admitting: Family Medicine

## 2023-09-13 ENCOUNTER — Ambulatory Visit (INDEPENDENT_AMBULATORY_CARE_PROVIDER_SITE_OTHER): Admitting: Family

## 2023-09-13 ENCOUNTER — Encounter: Payer: Self-pay | Admitting: Family Medicine

## 2023-09-13 VITALS — BP 157/85 | HR 98 | Temp 99.6°F | Resp 16 | Ht 66.0 in | Wt 169.0 lb

## 2023-09-13 DIAGNOSIS — U071 COVID-19: Secondary | ICD-10-CM | POA: Diagnosis not present

## 2023-09-13 DIAGNOSIS — I10 Essential (primary) hypertension: Secondary | ICD-10-CM | POA: Diagnosis not present

## 2023-09-13 DIAGNOSIS — R52 Pain, unspecified: Secondary | ICD-10-CM | POA: Diagnosis not present

## 2023-09-13 LAB — POCT INFLUENZA A/B
Influenza A, POC: NEGATIVE
Influenza B, POC: NEGATIVE

## 2023-09-13 LAB — POC COVID19 BINAXNOW: SARS Coronavirus 2 Ag: POSITIVE — AB

## 2023-09-13 MED ORDER — PAXLOVID (300/100) 20 X 150 MG & 10 X 100MG PO TBPK: 3.0000 | ORAL_TABLET | Freq: Two times a day (BID) | ORAL | 0 refills | Status: AC

## 2023-09-13 NOTE — Assessment & Plan Note (Signed)
 BP Readings from Last 3 Encounters:  09/13/23 (!) 157/85  03/28/23 (!) 148/88  03/24/23 (!) 162/100   BP elevated today but did not take losartan . Advised pt to restart losartan  and keep upcoming appointment with PCP.

## 2023-09-13 NOTE — Patient Instructions (Signed)
 VISIT SUMMARY:  During your visit, we discussed your symptoms of chills, body aches, nasal congestion, and elevated blood pressure. We performed tests to determine if you have a viral infection and provided guidance on managing your symptoms and hypertension.  YOUR PLAN:  COVID-19 Infection: Your symptoms are due to COVID-19 infection. -We performed influenza and COVID-19 tests to confirm the diagnosis. -You can take guaifenesin for congestion if needed. -Avoid medications containing phenylephrine/sudafed due to your high blood pressure. -Monitor your symptoms and let us  know if they worsen or do not improve within a week. -We have sent an rx to your pharmacy for Paxlovid  (antiviral medication for COVID-19).   HYPERTENSION: Your blood pressure is elevated, likely due to missing your losartan  dose. -You can safely take losartan  with NyQuil. -Avoid medications containing phenylephrine or pseudoephedrine. -Follow up with Dr. Watt for your hypertension management.

## 2023-09-13 NOTE — Assessment & Plan Note (Addendum)
 Rapid flu test is negative, covid test is positive.  - Advise rest and symptomatic treatment with NyQuil at night and DayQuil during the day. - Instruct to avoid sudafed/phenylephrine-containing medications due to potential exacerbation of hypertension. - Advise to monitor symptoms and report if she worsens or does not follow the typical viral course of about a week. -Pt advised that current treatment recommendations for paxlovid  are for 75+ and patients with high risk conditions.  She reached back out to PCP after visit inquiring about paxlovid .  Case discussed with PCP.  Rx sent for Paxlovid .

## 2023-09-13 NOTE — Progress Notes (Signed)
 Subjective:     Patient ID: Ruth Cooper, female    DOB: [birth year], 54 y.o.   MRN: [id number]  Chief Complaint  Patient presents with   Generalized Body Aches    Complains of body aches since yesterday   Nasal Congestion    Patient complains of nasal congestion     HPI  Discussed the use of AI scribe software for clinical note transcription with the patient, who gave verbal consent to proceed.  History of Present Illness Ruth Cooper is a 53 year old female with hypertension who presents with chills, body aches, and nasal congestion. She has been busy visiting her husband who had a serious stroke and is in a Rehab facility.   Chills and body aches began yesterday, with nasal congestion developing today. She has a low-grade fever, peaking at 100.39F, and a scratchy throat. A cough is present with yellow sputum. She took Nyquil last night but no other medication for the fever.  She skipped her usual dose of losartan  last night due to concerns about interactions with Nyquil but plans to resume it tonight. Her blood pressure is elevated at 157/85 mmHg today but she did not take her losartan  last night.  She states she has an upcoming appointment for hypertension management with PCP.   She is cautious about spreading illness, wearing a mask and practicing hand hygiene. She inquires about the safety of using Mucinex during the day and Nyquil at night with losartan .      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    Past Surgical History:  Procedure Laterality Date   BACK  SURGERY  12/08/2017   cervical disc    CERVICAL DISC ARTHROPLASTY  2020   ENDOMETRIAL ABLATION  2020   TONSILLECTOMY  1997   WISDOM TOOTH EXTRACTION      Family History  Problem Relation Age of Onset   Colon cancer Mother        Living   Breast cancer Mother 49       again @ 30 - BrCA +   Healthy Father        Living   Healthy Daughter        x1   Breast cancer Maternal Grandmother 58   Colon cancer Maternal Grandfather    Alzheimer's disease Paternal Grandmother    Heart disease Paternal Grandfather    Hypertension Paternal Grandfather    Healthy Brother        x1   Healthy Son        x2   Allergies Son    Esophageal cancer Neg Hx    Stomach cancer Neg Hx    Rectal cancer Neg Hx     Social History   Socioeconomic History   Marital status: Married    Spouse name: Not on file   Number of children: 3   Years of education: Not on file   Highest education level: Bachelor's degree (e.g., BA, AB, BS)  Occupational History   Occupation: office work  Tobacco  Use   Smoking status: Never   Smokeless tobacco: Never  Vaping Use   Vaping status: Never Used  Substance and Sexual Activity   Alcohol use: No   Drug use: No   Sexual activity: Not Currently  Other Topics Concern   Not on file  Social History Narrative   Patient works as Engineer, civil (consulting).        Patient currently married with 3 children, all healthy.       -- 15, 12, 9      Lives in a 2 story home.        Education: college         Social Drivers of Health   Financial Resource Strain: Patient Declined (03/24/2023)   Overall Financial Resource Strain (CARDIA)    Difficulty of Paying Living Expenses: Patient declined  Food Insecurity: Patient Declined (03/24/2023)   Hunger Vital Sign    Worried About Running Out of Food in the Last Year: Patient declined    Ran Out of Food in the Last Year: Patient declined  Transportation Needs: Patient Declined (03/24/2023)   PRAPARE - Doctor, general practice (Medical): Patient declined    Lack of Transportation (Non-Medical): Patient declined  Physical Activity: Unknown (03/24/2023)   Exercise Vital Sign    Days of Exercise per Week: 0 days    Minutes of Exercise per Session: Not on file  Stress: Stress Concern Present (03/24/2023)   [employer] of Occupational Health - Occupational Stress Questionnaire    Feeling of Stress : To some extent  Social Connections: Unknown (09/13/2023)   Social Connection and Isolation Panel    Frequency of Communication with Friends and Family: Not on file    Frequency of Social Gatherings with Friends and Family: Not on file    Attends Religious Services: Not on file    Active Member of Clubs or Organizations: Not on file    Attends Banker Meetings: Not on file    Marital Status: Married  Intimate Partner Violence: Not on file    Outpatient Medications Prior to Visit  Medication Sig Dispense Refill   losartan  (COZAAR ) 25 MG tablet Take 1 tablet (25 mg total) by mouth daily. 90 tablet 0   MELATONIN PO Take by mouth.     No facility-administered medications prior to visit.    No Known Allergies  ROS See HPI    Objective:    Physical Exam Constitutional:      General: She is not in acute distress.    Appearance: Normal appearance. She is well-developed.  HENT:     Head: Normocephalic and atraumatic.     Right Ear: Tympanic membrane, ear canal and external ear normal.     Left Ear: Tympanic membrane, ear canal and external ear normal.  Eyes:     General: No scleral icterus. Neck:     Thyroid : No thyromegaly.  Cardiovascular:     Rate and Rhythm: Normal rate and regular rhythm.     Heart sounds: Normal heart sounds. No murmur heard. Pulmonary:     Effort: Pulmonary effort is normal. No respiratory distress.     Breath sounds: Normal breath sounds. No wheezing.  Musculoskeletal:     Cervical back: Neck supple.  Skin:    General: Skin is warm and dry.   Neurological:     Mental Status: She is alert and oriented to person, place, and time.  Psychiatric:        Mood  and Affect: Mood normal.        Behavior: Behavior normal.        Thought Content: Thought content normal.        Judgment: Judgment normal.      BP (!) 157/85 (BP Location: Right Arm, Patient Position: Sitting, Cuff Size: Normal)   Pulse 98   Temp 99.6 F (37.6 C) (Oral)   Resp 16   Ht 5' 6 (1.676 m)   Wt 169 lb (76.7 kg)   SpO2 97%   BMI 27.28 kg/m  Wt Readings from Last 3 Encounters:  09/13/23 169 lb (76.7 kg)  03/28/23 172 lb 9.6 oz (78.3 kg)  03/24/23 171 lb 2 oz (77.6 kg)       Assessment & Plan:   Problem List Items Addressed This Visit       Unprioritized   Hypertension   BP Readings from Last 3 Encounters:  09/13/23 (!) 157/85  03/28/23 (!) 148/88  03/24/23 (!) 162/100   BP elevated today but did not take losartan . Advised pt to restart losartan  and keep upcoming appointment with PCP.       COVID-19 virus infection   Rapid flu test is negative, covid test is positive.  - Advise rest and symptomatic treatment with NyQuil at night and DayQuil during the day. - Instruct to avoid sudafed/phenylephrine-containing medications due to potential exacerbation of hypertension. - Advise to monitor symptoms and report if she worsens or does not follow the typical viral course of about a week. -Pt advised that current treatment recommendations for paxlovid  are for 75+ and patients with high risk conditions.  She reached back out to PCP after visit inquiring about paxlovid .  Case discussed with PCP.  Rx sent for Paxlovid .        Relevant Medications   nirmatrelvir/ritonavir (PAXLOVID , 300/100,) 20 x 150 MG & 10 x 100MG  TBPK   Other Visit Diagnoses       Generalized body aches    -  Primary   Relevant Medications   nirmatrelvir/ritonavir (PAXLOVID , 300/100,) 20 x 150 MG & 10 x 100MG  TBPK   Other Relevant Orders   POC COVID-19 (Completed)   POCT  Influenza A/B (Completed)       I am having Ruth MYRTIS Mannie Luke start on Paxlovid  (300/100). I am also having her maintain her MELATONIN PO and losartan .  Meds ordered this encounter  Medications   nirmatrelvir/ritonavir (PAXLOVID , 300/100,) 20 x 150 MG & 10 x 100MG  TBPK    Sig: Take 3 tablets by mouth 2 (two) times daily for 5 days.    Dispense:  10 tablet    Refill:  0    Supervising Provider:   DOMENICA BLACKBIRD A [[id number]]

## 2023-09-15 ENCOUNTER — Encounter: Payer: Self-pay | Admitting: Family Medicine

## 2023-09-16 ENCOUNTER — Encounter: Payer: Self-pay | Admitting: Advanced Practice Midwife

## 2023-09-17 NOTE — Progress Notes (Unsigned)
 [facility] [address] [place], [place] [address] [phone]  Date:  09/22/2023   Name:  Ruth Cooper   DOB:  Sep 18, 1970   MRN:  [id number]  PCP:  Watt Harlene BROCKS, MD    Chief Complaint: No chief complaint on file.   History of Present Illness:  Ruth Cooper is a 53 y.o. very pleasant female patient who presents with the following:  Pt seen today for recheck and to discuss BP Last seen by myself in January for her CPE; that that time she recently saw my partner Manuelita with concern of elevated BP  She was in last week with covid 19- BP still high at that time Treated with paxlovid  She is taking losartan  25 mg    Recommend shingrix, tetanus  Patient Active Problem List   Diagnosis Date Noted   COVID-19 virus infection 09/13/2023   Ascending aorta dilatation (HCC) 04/15/2023   Hypertension 03/24/2023   Frequent headaches 03/24/2023   Benign microscopic hematuria 11/05/2015   Migraine without aura and without status migrainosus, not intractable 10/31/2015   Memory loss 10/31/2015   Knee pain, right 06/03/2013    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    Past Surgical History:  Procedure Laterality Date   BACK SURGERY  12/08/2017   cervical disc    CERVICAL DISC ARTHROPLASTY  2020   ENDOMETRIAL ABLATION  2020   TONSILLECTOMY  1997   WISDOM TOOTH EXTRACTION      Social History   Tobacco Use   Smoking status: Never   Smokeless tobacco: Never  Vaping Use   Vaping status: Never Used  Substance Use Topics   Alcohol use: No   Drug use: No    Family History  Problem Relation Age of Onset   Colon cancer Mother        Living   Breast cancer Mother 48       again @ 22 - BrCA +   Healthy Father        Living   Healthy Daughter        x1   Breast cancer  Maternal Grandmother 93   Colon cancer Maternal Grandfather    Alzheimer's disease Paternal Grandmother    Heart disease Paternal Grandfather    Hypertension Paternal Grandfather    Healthy Brother        x1   Healthy Son        x2   Allergies Son    Esophageal cancer Neg Hx    Stomach cancer Neg Hx    Rectal cancer Neg Hx     No Known Allergies  Medication list has been reviewed and updated.  Current Outpatient Medications on File Prior to Visit  Medication Sig Dispense Refill   losartan  (COZAAR ) 25 MG tablet Take 1 tablet (25 mg total) by mouth daily. 90 tablet 0   MELATONIN PO Take by mouth.     nirmatrelvir/ritonavir (PAXLOVID , 300/100,) 20 x 150 MG & 10 x 100MG  TBPK Take 3 tablets by mouth 2 (two) times daily for 5 days. 10 tablet 0   No current facility-administered medications on file prior to visit.    Review of Systems:  As per HPI- otherwise negative.   Physical Examination: There were no vitals filed for this visit. There were  no vitals filed for this visit. There is no height or weight on file to calculate BMI. Ideal Body Weight:    GEN: no acute distress. HEENT: Atraumatic, Normocephalic.  Ears and Nose: No external deformity. CV: RRR, No M/G/R. No JVD. No thrill. No extra heart sounds. PULM: CTA B, no wheezes, crackles, rhonchi. No retractions. No resp. distress. No accessory muscle use. ABD: S, NT, ND, +BS. No rebound. No HSM. EXTR: No c/c/e PSYCH: Normally interactive. Conversant.    Assessment and Plan: ***  Signed Harlene Schroeder, MD

## 2023-09-22 ENCOUNTER — Encounter: Payer: Self-pay | Admitting: Family Medicine

## 2023-09-22 ENCOUNTER — Ambulatory Visit (INDEPENDENT_AMBULATORY_CARE_PROVIDER_SITE_OTHER): Admitting: Family Medicine

## 2023-09-22 VITALS — BP 136/88 | HR 75 | Ht 66.0 in | Wt 167.4 lb

## 2023-09-22 DIAGNOSIS — Z131 Encounter for screening for diabetes mellitus: Secondary | ICD-10-CM

## 2023-09-22 DIAGNOSIS — I10 Essential (primary) hypertension: Secondary | ICD-10-CM

## 2023-09-22 DIAGNOSIS — Z13220 Encounter for screening for lipoid disorders: Secondary | ICD-10-CM

## 2023-09-22 DIAGNOSIS — Z1329 Encounter for screening for other suspected endocrine disorder: Secondary | ICD-10-CM | POA: Diagnosis not present

## 2023-09-22 DIAGNOSIS — F5101 Primary insomnia: Secondary | ICD-10-CM

## 2023-09-22 LAB — CBC
HCT: 42.8 % (ref 36.0–46.0)
Hemoglobin: 14.6 g/dL (ref 12.0–15.0)
MCHC: 34 g/dL (ref 30.0–36.0)
MCV: 89.4 fl (ref 78.0–100.0)
Platelets: 167 K/uL (ref 150.0–400.0)
RBC: 4.79 Mil/uL (ref 3.87–5.11)
RDW: 13.4 % (ref 11.5–15.5)
WBC: 3.4 K/uL — ABNORMAL LOW (ref 4.0–10.5)

## 2023-09-22 LAB — COMPREHENSIVE METABOLIC PANEL WITH GFR
ALT: 22 U/L (ref 0–35)
AST: 25 U/L (ref 0–37)
Albumin: 4.7 g/dL (ref 3.5–5.2)
Alkaline Phosphatase: 77 U/L (ref 39–117)
BUN: 12 mg/dL (ref 6–23)
CO2: 29 meq/L (ref 19–32)
Calcium: 9.4 mg/dL (ref 8.4–10.5)
Chloride: 104 meq/L (ref 96–112)
Creatinine, Ser: 0.64 mg/dL (ref 0.40–1.20)
GFR: 101.17 mL/min (ref >60.00)
Glucose, Bld: 97 mg/dL (ref 70–99)
Potassium: 3.6 meq/L (ref 3.5–5.1)
Sodium: 142 meq/L (ref 135–145)
Total Bilirubin: 0.7 mg/dL (ref 0.2–1.2)
Total Protein: 7.1 g/dL (ref 6.0–8.3)

## 2023-09-22 LAB — LIPID PANEL
Cholesterol: 193 mg/dL (ref 0–200)
HDL: 45.1 mg/dL (ref >39.00)
LDL Cholesterol: 124 mg/dL — ABNORMAL HIGH (ref 0–99)
NonHDL: 147.84
Total CHOL/HDL Ratio: 4
Triglycerides: 121 mg/dL (ref 0.0–149.0)
VLDL: 24.2 mg/dL (ref 0.0–40.0)

## 2023-09-22 LAB — HEMOGLOBIN A1C: Hgb A1c MFr Bld: 5.5 % (ref 4.6–6.5)

## 2023-09-22 LAB — TSH: TSH: 1.03 u[IU]/mL (ref 0.35–5.50)

## 2023-09-22 MED ORDER — TRAZODONE HCL 50 MG PO TABS: 25.0000 mg | ORAL_TABLET | Freq: Every evening | ORAL | 3 refills | Status: DC | PRN

## 2023-09-22 NOTE — Patient Instructions (Addendum)
 It was good to see you today- I am so sorry this happened to your family For the time being ok to continue on losartan  25; please check a few BP readings at home and update me in a week or so We might want to bump your dose to 50 mg if your BP is still borderline   We will try some trazodone  for your insomnia   I will be in touch with your labs asap   I would recommend getting the shingles vaccine series when you are fully well

## 2023-10-14 ENCOUNTER — Other Ambulatory Visit: Payer: Self-pay | Admitting: Family Medicine

## 2023-10-14 DIAGNOSIS — F5101 Primary insomnia: Secondary | ICD-10-CM

## 2023-10-23 ENCOUNTER — Other Ambulatory Visit: Payer: Self-pay | Admitting: Family Medicine

## 2023-10-23 DIAGNOSIS — I10 Essential (primary) hypertension: Secondary | ICD-10-CM

## 2023-11-11 ENCOUNTER — Inpatient Hospital Stay (HOSPITAL_BASED_OUTPATIENT_CLINIC_OR_DEPARTMENT_OTHER)
Admission: EM | Admit: 2023-11-11 | Discharge: 2023-11-17 | DRG: 418 | Disposition: A | Discharge status: Home / Self Care | Attending: Family Medicine | Admitting: Family Medicine

## 2023-11-11 ENCOUNTER — Other Ambulatory Visit: Payer: Self-pay

## 2023-11-11 ENCOUNTER — Encounter (HOSPITAL_BASED_OUTPATIENT_CLINIC_OR_DEPARTMENT_OTHER): Payer: Self-pay

## 2023-11-11 ENCOUNTER — Emergency Department (HOSPITAL_BASED_OUTPATIENT_CLINIC_OR_DEPARTMENT_OTHER)

## 2023-11-11 DIAGNOSIS — K8066 Calculus of gallbladder and bile duct with acute and chronic cholecystitis without obstruction: Secondary | ICD-10-CM | POA: Diagnosis present

## 2023-11-11 DIAGNOSIS — K760 Fatty (change of) liver, not elsewhere classified: Secondary | ICD-10-CM | POA: Diagnosis present

## 2023-11-11 DIAGNOSIS — N179 Acute kidney failure, unspecified: Secondary | ICD-10-CM | POA: Diagnosis not present

## 2023-11-11 DIAGNOSIS — A419 Sepsis, unspecified organism: Secondary | ICD-10-CM | POA: Diagnosis not present

## 2023-11-11 DIAGNOSIS — Z9049 Acquired absence of other specified parts of digestive tract: Secondary | ICD-10-CM | POA: Diagnosis not present

## 2023-11-11 DIAGNOSIS — K2950 Unspecified chronic gastritis without bleeding: Secondary | ICD-10-CM | POA: Diagnosis present

## 2023-11-11 DIAGNOSIS — D649 Anemia, unspecified: Secondary | ICD-10-CM | POA: Diagnosis not present

## 2023-11-11 DIAGNOSIS — J9811 Atelectasis: Secondary | ICD-10-CM | POA: Diagnosis not present

## 2023-11-11 DIAGNOSIS — K9161 Intraoperative hemorrhage and hematoma of a digestive system organ or structure complicating a digestive sytem procedure: Secondary | ICD-10-CM | POA: Diagnosis not present

## 2023-11-11 DIAGNOSIS — I9581 Postprocedural hypotension: Secondary | ICD-10-CM | POA: Diagnosis not present

## 2023-11-11 DIAGNOSIS — R7989 Other specified abnormal findings of blood chemistry: Secondary | ICD-10-CM | POA: Diagnosis present

## 2023-11-11 DIAGNOSIS — R7401 Elevation of levels of liver transaminase levels: Secondary | ICD-10-CM | POA: Diagnosis not present

## 2023-11-11 DIAGNOSIS — Z79899 Other long term (current) drug therapy: Secondary | ICD-10-CM

## 2023-11-11 DIAGNOSIS — K8062 Calculus of gallbladder and bile duct with acute cholecystitis without obstruction: Secondary | ICD-10-CM | POA: Diagnosis present

## 2023-11-11 DIAGNOSIS — I10 Essential (primary) hypertension: Secondary | ICD-10-CM | POA: Diagnosis present

## 2023-11-11 DIAGNOSIS — K2960 Other gastritis without bleeding: Secondary | ICD-10-CM

## 2023-11-11 DIAGNOSIS — R0603 Acute respiratory distress: Secondary | ICD-10-CM | POA: Diagnosis not present

## 2023-11-11 DIAGNOSIS — Z8249 Family history of ischemic heart disease and other diseases of the circulatory system: Secondary | ICD-10-CM

## 2023-11-11 DIAGNOSIS — Z820 Family history of epilepsy and other diseases of the nervous system: Secondary | ICD-10-CM

## 2023-11-11 DIAGNOSIS — G4452 New daily persistent headache (NDPH): Secondary | ICD-10-CM | POA: Diagnosis not present

## 2023-11-11 DIAGNOSIS — K8050 Calculus of bile duct without cholangitis or cholecystitis without obstruction: Secondary | ICD-10-CM | POA: Diagnosis not present

## 2023-11-11 DIAGNOSIS — K219 Gastro-esophageal reflux disease without esophagitis: Secondary | ICD-10-CM | POA: Diagnosis present

## 2023-11-11 DIAGNOSIS — K8051 Calculus of bile duct without cholangitis or cholecystitis with obstruction: Secondary | ICD-10-CM | POA: Diagnosis not present

## 2023-11-11 DIAGNOSIS — Y838 Other surgical procedures as the cause of abnormal reaction of the patient, or of later complication, without mention of misadventure at the time of the procedure: Secondary | ICD-10-CM | POA: Diagnosis not present

## 2023-11-11 DIAGNOSIS — K810 Acute cholecystitis: Secondary | ICD-10-CM | POA: Diagnosis not present

## 2023-11-11 DIAGNOSIS — F410 Panic disorder [episodic paroxysmal anxiety] without agoraphobia: Secondary | ICD-10-CM | POA: Diagnosis present

## 2023-11-11 DIAGNOSIS — E876 Hypokalemia: Secondary | ICD-10-CM | POA: Diagnosis present

## 2023-11-11 DIAGNOSIS — D696 Thrombocytopenia, unspecified: Secondary | ICD-10-CM | POA: Diagnosis not present

## 2023-11-11 DIAGNOSIS — Z7982 Long term (current) use of aspirin: Secondary | ICD-10-CM

## 2023-11-11 DIAGNOSIS — I959 Hypotension, unspecified: Secondary | ICD-10-CM | POA: Diagnosis not present

## 2023-11-11 DIAGNOSIS — Z803 Family history of malignant neoplasm of breast: Secondary | ICD-10-CM | POA: Diagnosis not present

## 2023-11-11 DIAGNOSIS — E860 Dehydration: Secondary | ICD-10-CM | POA: Diagnosis present

## 2023-11-11 DIAGNOSIS — K838 Other specified diseases of biliary tract: Secondary | ICD-10-CM | POA: Diagnosis not present

## 2023-11-11 DIAGNOSIS — K8070 Calculus of gallbladder and bile duct without cholecystitis without obstruction: Secondary | ICD-10-CM | POA: Diagnosis not present

## 2023-11-11 DIAGNOSIS — Z723 Lack of physical exercise: Secondary | ICD-10-CM | POA: Diagnosis not present

## 2023-11-11 DIAGNOSIS — D62 Acute posthemorrhagic anemia: Secondary | ICD-10-CM | POA: Diagnosis not present

## 2023-11-11 DIAGNOSIS — R061 Stridor: Secondary | ICD-10-CM | POA: Diagnosis not present

## 2023-11-11 DIAGNOSIS — Z800 Family history of malignant neoplasm of digestive organs: Secondary | ICD-10-CM

## 2023-11-11 DIAGNOSIS — K3189 Other diseases of stomach and duodenum: Secondary | ICD-10-CM | POA: Diagnosis present

## 2023-11-11 DIAGNOSIS — R1013 Epigastric pain: Secondary | ICD-10-CM

## 2023-11-11 LAB — COMPREHENSIVE METABOLIC PANEL WITH GFR
ALT: 508 U/L — ABNORMAL HIGH (ref 0–44)
AST: 564 U/L — ABNORMAL HIGH (ref 15–41)
Albumin: 4.6 g/dL (ref 3.5–5.0)
Alkaline Phosphatase: 284 U/L — ABNORMAL HIGH (ref 38–126)
Anion gap: 12 (ref 5–15)
BUN: 12 mg/dL (ref 6–20)
CO2: 24 mmol/L (ref 22–32)
Calcium: 9 mg/dL (ref 8.9–10.3)
Chloride: 104 mmol/L (ref 98–111)
Creatinine, Ser: 0.8 mg/dL (ref 0.44–1.00)
GFR, Estimated: >60 mL/min (ref >60)
Glucose, Bld: 139 mg/dL — ABNORMAL HIGH (ref 70–99)
Potassium: 3.7 mmol/L (ref 3.5–5.1)
Sodium: 140 mmol/L (ref 135–145)
Total Bilirubin: 1.9 mg/dL — ABNORMAL HIGH (ref 0.0–1.2)
Total Protein: 7 g/dL (ref 6.5–8.1)

## 2023-11-11 LAB — URINALYSIS, MICROSCOPIC (REFLEX)
Bacteria, UA: NONE SEEN
WBC, UA: NONE SEEN WBC/hpf (ref 0–5)

## 2023-11-11 LAB — CBC WITH DIFFERENTIAL/PLATELET
Abs Immature Granulocytes: 0.04 K/uL (ref 0.00–0.07)
Basophils Absolute: 0 K/uL (ref 0.0–0.1)
Basophils Relative: 0 %
Eosinophils Absolute: 0.1 K/uL (ref 0.0–0.5)
Eosinophils Relative: 1 %
HCT: 43.5 % (ref 36.0–46.0)
Hemoglobin: 14.6 g/dL (ref 12.0–15.0)
Immature Granulocytes: 0 %
Lymphocytes Relative: 3 %
Lymphs Abs: 0.3 K/uL — ABNORMAL LOW (ref 0.7–4.0)
MCH: 30.5 pg (ref 26.0–34.0)
MCHC: 33.6 g/dL (ref 30.0–36.0)
MCV: 91 fL (ref 80.0–100.0)
Monocytes Absolute: 0.3 K/uL (ref 0.1–1.0)
Monocytes Relative: 3 %
Neutro Abs: 9.8 K/uL — ABNORMAL HIGH (ref 1.7–7.7)
Neutrophils Relative %: 93 %
Platelets: 163 K/uL (ref 150–400)
RBC: 4.78 MIL/uL (ref 3.87–5.11)
RDW: 13.6 % (ref 11.5–15.5)
WBC: 10.7 K/uL — ABNORMAL HIGH (ref 4.0–10.5)
nRBC: 0 % (ref 0.0–0.2)

## 2023-11-11 LAB — URINALYSIS, ROUTINE W REFLEX MICROSCOPIC
Glucose, UA: NEGATIVE mg/dL
Ketones, ur: NEGATIVE mg/dL
Leukocytes,Ua: NEGATIVE
Nitrite: NEGATIVE
Protein, ur: NEGATIVE mg/dL
Specific Gravity, Urine: 1.02 (ref 1.005–1.030)
pH: 7.5 (ref 5.0–8.0)

## 2023-11-11 LAB — LIPASE, BLOOD: Lipase: 29 U/L (ref 11–51)

## 2023-11-11 MED ORDER — MORPHINE SULFATE (PF) 2 MG/ML IV SOLN
2.0000 mg | Freq: Once | INTRAVENOUS | Status: AC
  Administered 2023-11-11: 2 mg via INTRAVENOUS
  Filled 2023-11-11: qty 1

## 2023-11-11 MED ORDER — PANTOPRAZOLE SODIUM 40 MG PO TBEC
40.0000 mg | DELAYED_RELEASE_TABLET | Freq: Every day | ORAL | Status: DC
  Administered 2023-11-12 – 2023-11-13 (×2): 40 mg via ORAL
  Filled 2023-11-11 (×3): qty 1

## 2023-11-11 MED ORDER — ONDANSETRON HCL 4 MG/2ML IJ SOLN: 4.0000 mg | Freq: Four times a day (QID) | INTRAMUSCULAR | Status: DC | PRN

## 2023-11-11 MED ORDER — SODIUM CHLORIDE 0.9 % IV BOLUS
1000.0000 mL | Freq: Once | INTRAVENOUS | Status: AC
  Administered 2023-11-11: 1000 mL via INTRAVENOUS

## 2023-11-11 MED ORDER — PIPERACILLIN-TAZOBACTAM 3.375 G IVPB 30 MIN
3.3750 g | Freq: Once | INTRAVENOUS | Status: AC
  Administered 2023-11-11: 3.375 g via INTRAVENOUS
  Filled 2023-11-11: qty 50

## 2023-11-11 MED ORDER — FAMOTIDINE IN NACL 20-0.9 MG/50ML-% IV SOLN
20.0000 mg | Freq: Once | INTRAVENOUS | Status: AC
Start: 2023-11-11 — End: 2023-11-11
  Administered 2023-11-11: 20 mg via INTRAVENOUS
  Filled 2023-11-11: qty 50

## 2023-11-11 MED ORDER — MORPHINE SULFATE (PF) 2 MG/ML IV SOLN: 1.0000 mg | INTRAVENOUS | Status: DC | PRN

## 2023-11-11 NOTE — Plan of Care (Signed)
 [facility] to [facility] medical telemetry bed transfer:  53 year old female past medical history essential hypertension, migraine and osteoarthritis presented to emergency department complaining of epigastric area abdominal pain.atient states that started today.  Initially started little bit this morning after eating a muffin.  Subsequently became worse after eating [employer] sandwich.  Endorses cramping and epigastric abdominal area.  1 episode of emesis.  Has had bowel movements today.  No bright red blood per rectum.  No melena.  Denies any GI history.  No previous GI surgeries.  Currently takes no PPI or H2 blockers.  Deni for patient es any history of diagnosis of GERD or gastritis.  No gastric ulcer history.  Patient denies any kind of chest pain or shortness of breath.  No exertional chest pain.  Patient states is all in her abdomen.  Patient states this happened before.  Happened a couple days ago as well after eating.  Patient states it happened a couple years ago as well to have subsequently resolved on its own.  At presentation to ED patient found borderline hypertensive otherwise hemodynamically stable. CMP showed elevated AST/ALT/ALP and bilirubin 1.9.  Normal lipase level.  Ultrasound showed: Mildly distended common bile duct measuring 8.2 mm with intrahepatic biliary dilation. his suggests a distal obstruction. No obstructing lesion is visualized. 2. Hepatic steatosis.  CBC showing leukocytosis 10.7 otherwise unremarkable.   In the ED patient has been given morphine , Zosyn  and Pepcid .  Dr. Jackquline spoke with [facility] recommended admit to [facility] and patient eventually need ERCP however ERCP is not available over the weekend and cannot be done before Monday until then recommended continue conservative management.  Hospitalist has been consulted for further evaluation management of choledocholithiasis-that is causing transaminitis and hyperbilirubinemia.

## 2023-11-11 NOTE — Progress Notes (Signed)
 Pt arrived to [facility], via transport from [facility]. Pt is A&Ox4, ambulatory and no signs of distress present. Pt resting in bed.

## 2023-11-11 NOTE — Progress Notes (Signed)
 Pt's husband suffered a stroke a few weeks ago, per pt. She said her husband is in no position to be the emergency contact at this time and would like for her mom, Rock Ishihara to be contacted with any emergent needs or updates.  Rock: [phone]

## 2023-11-11 NOTE — ED Notes (Signed)
[facility] at bedside for transport. 

## 2023-11-11 NOTE — ED Notes (Signed)
 [facility] called for transport.

## 2023-11-11 NOTE — ED Provider Notes (Signed)
  EMERGENCY DEPARTMENT AT [facility] Provider Note   CSN: [id number] Arrival date & time: 11/11/23  1726     Patient presents with: Abdominal Pain   Ruth Cooper is a 53 y.o. female.   HPI     Patient presents because of epigastric abdominal pain.  Patient states that started today.  Initially started little bit this morning after eating a muffin.  Subsequently became worse after eating [employer] sandwich.  Endorses cramping and epigastric abdominal area.  1 episode of emesis.  Has had bowel movements today.  No bright red blood per rectum.  No melena.  Denies any GI history.  No previous GI surgeries.  Currently takes no PPI or H2 blockers.  Denies any history of diagnosis of GERD or gastritis.  No gastric ulcer history.  Patient denies any kind of chest pain or shortness of breath.  No exertional chest pain.  Patient states is all in her abdomen.  Patient states this happened before.  Happened a couple days ago as well after eating.  Patient states it happened a couple years ago as well to have subsequently resolved on its own.  Prior to Admission medications   Medication Sig Start Date End Date Taking? Authorizing Provider  losartan  (COZAAR ) 25 MG tablet TAKE 1 TABLET (25 MG TOTAL) BY MOUTH DAILY. 10/24/23   Copland, Jessica C, MD  MELATONIN PO Take by mouth. Patient not taking: Reported on 09/22/2023    [provider]  traZODone  (DESYREL ) 50 MG tablet Take 0.5-1 tablets (25-50 mg total) by mouth at bedtime as needed for sleep. 10/14/23   Copland, Jessica C, MD    Allergies: Patient has no known allergies.    Review of Systems  Constitutional:  Negative for chills and fever.  HENT:  Negative for ear pain and sore throat.   Eyes:  Negative for pain and visual disturbance.  Respiratory:  Negative for cough and shortness of breath.   Cardiovascular:  Negative for chest pain and palpitations.  Gastrointestinal:  Negative for abdominal pain and  vomiting.  Genitourinary:  Negative for dysuria and hematuria.  Musculoskeletal:  Negative for arthralgias and back pain.  Skin:  Negative for color change and rash.  Neurological:  Negative for seizures and syncope.  All other systems reviewed and are negative.   Updated Vital Signs BP (!) 156/95 (BP Location: Left Arm)   Pulse 87   Temp 98.6 F (37 C) (Oral)   Resp 15   Ht 5' 6 (1.676 m)   Wt 75.9 kg   SpO2 98%   BMI 27.01 kg/m   Physical Exam Vitals and nursing note reviewed.  Constitutional:      General: She is not in acute distress.    Appearance: She is well-developed.  HENT:     Head: Normocephalic and atraumatic.  Eyes:     Conjunctiva/sclera: Conjunctivae normal.  Cardiovascular:     Rate and Rhythm: Normal rate and regular rhythm.     Heart sounds: No murmur heard. Pulmonary:     Effort: Pulmonary effort is normal. No respiratory distress.     Breath sounds: Normal breath sounds.  Abdominal:     Palpations: Abdomen is soft.     Tenderness: There is no abdominal tenderness.  Musculoskeletal:        General: No swelling.     Cervical back: Neck supple.  Skin:    General: Skin is warm and dry.     Capillary Refill: Capillary refill takes  less than 2 seconds.  Neurological:     Mental Status: She is alert.  Psychiatric:        Mood and Affect: Mood normal.     (all labs ordered are listed, but only abnormal results are displayed) Labs Reviewed  CBC WITH DIFFERENTIAL/PLATELET - Abnormal; Notable for the following components:      Result Value   WBC 10.7 (*)    Neutro Abs 9.8 (*)    Lymphs Abs 0.3 (*)    All other components within normal limits  COMPREHENSIVE METABOLIC PANEL WITH GFR - Abnormal; Notable for the following components:   Glucose, Bld 139 (*)    AST 564 (*)    ALT 508 (*)    Alkaline Phosphatase 284 (*)    Total Bilirubin 1.9 (*)    All other components within normal limits  URINALYSIS, ROUTINE W REFLEX MICROSCOPIC - Abnormal;  Notable for the following components:   Hgb urine dipstick MODERATE (*)    Bilirubin Urine SMALL (*)    All other components within normal limits  LIPASE, BLOOD  URINALYSIS, MICROSCOPIC (REFLEX)    EKG: None  Radiology: US  Abdomen Limited RUQ (LIVER/GB) Result Date: 11/11/2023 EXAM: Right Upper Quadrant Abdominal Ultrasound TECHNIQUE: Real-time ultrasonography of the right upper quadrant of the abdomen was performed. COMPARISON: None. CLINICAL HISTORY: Pain. FINDINGS: LIVER: The liver demonstrates increased echogenicity. Intrahepatic biliary ductal dilatation is present. No discrete lesions are visualized. BILIARY SYSTEM: Gallbladder wall thickness is within normal limits at 1.4 mm. No evidence of pericholecystic fluid. No cholelithiasis. Negative sonographic Murphy's sign. Common bile duct is mildly distended measuring 8.2 mm. RIGHT KIDNEY: The right kidney is grossly unremarkable in appearances without evidence of hydronephrosis, echogenic calculi or worrisome mass lesions. PANCREAS: Visualized portions of the pancreas are unremarkable. OTHER: No right upper quadrant ascites. IMPRESSION: 1. Mildly distended common bile duct measuring 8.2 mm with intrahepatic biliary dilation. his suggests a distal obstruction. No obstructing lesion is visualized. 2. Hepatic steatosis. Electronically signed by: Lonni Necessary MD 11/11/2023 06:27 PM EDT RP Workstation: [id number]     Procedures   Medications Ordered in the ED  morphine  (PF) 2 MG/ML injection 1 mg (has no administration in time range)  ondansetron  (ZOFRAN ) injection 4 mg (has no administration in time range)  pantoprazole  (PROTONIX ) EC tablet 40 mg (has no administration in time range)  morphine  (PF) 2 MG/ML injection 2 mg (2 mg Intravenous Given 11/11/23 1755)  famotidine  (PEPCID ) IVPB 20 mg premix (0 mg Intravenous Stopped 11/11/23 2016)  sodium chloride  0.9 % bolus 1,000 mL (0 mLs Intravenous Stopped 11/11/23 2016)   piperacillin -tazobactam (ZOSYN ) IVPB 3.375 g (0 g Intravenous Stopped 11/11/23 2016)    Clinical Course as of 11/11/23 2244  Fri Nov 11, 2023  1920 No ERCP coverage this weekend in [place]  [TL]  1923 [facility]  [TL]    Clinical Course User Index [TL] Simon Lavonia SAILOR, MD                                 Medical Decision Making Amount and/or Complexity of Data Reviewed Labs: ordered. Radiology: ordered.  Risk Prescription drug management. Decision regarding hospitalization.    Patient presents because of epigastric abdominal pain.  Patient states that started today.  Initially started little bit this morning after eating a muffin.  Subsequently became worse after eating [employer] sandwich.  Endorses cramping and epigastric abdominal area.  1 episode of  emesis.  Has had bowel movements today.  No bright red blood per rectum.  No melena.  Denies any GI history.  No previous GI surgeries.  Currently takes no PPI or H2 blockers.  Denies any history of diagnosis of GERD or gastritis.  No gastric ulcer history.  Patient denies any kind of chest pain or shortness of breath.  No exertional chest pain.  Patient states is all in her abdomen.  Patient states this happened before.  Happened a couple days ago as well after eating.  Patient states it happened a couple years ago as well to have subsequently resolved on its own.   Patient has pain to palpation and more of the epigastric area.  No rebound or guarding that I can appreciate.   Patient has had cholelithiasis in the past based off of chart review.   Given postprandial pain, did obtain laboratory workup as well as right upper quad ultrasound.  Laboratory workup consistent for LFT derangements.  Elevated alk phos and elevated total bilirubin.  Right upper quadrant ultrasound showed dilated CBD.  Concern for choledocholithiasis. She has had cholelithiasis in the past documented by U/S.   No anion gap concerning for lactic acidosis.   Afebrile.  Minimal  leukocytosis.  I did start patient on Zosyn  just because of the small leukocytosis though my concerns for any kind of ascending infection is very low at this time.  Will cover empirically.  Consulted GI. [facility].  Prefer patient at [facility].   Currently, no ERCP coverage over the weekend.  They will see the patient in the hospital tomorrow.  Will manage medically until Monday when they can perform ERCP on Monday.   No indication for CT imaging at this point time.  Patient remained medically stable.  Lipase negative.      Final diagnoses:  Choledocholithiasis  Epigastric pain  LFT elevation    ED Discharge Orders     None          Simon Lavonia SAILOR, MD 11/11/23 2244

## 2023-11-11 NOTE — ED Triage Notes (Signed)
 Arrives POV with complaints of intermittent upper abdominal pain x2 weeks. Pain worsened today and she reports nausea as well.

## 2023-11-12 ENCOUNTER — Inpatient Hospital Stay (HOSPITAL_COMMUNITY)

## 2023-11-12 DIAGNOSIS — R7989 Other specified abnormal findings of blood chemistry: Secondary | ICD-10-CM | POA: Diagnosis not present

## 2023-11-12 DIAGNOSIS — K8050 Calculus of bile duct without cholangitis or cholecystitis without obstruction: Secondary | ICD-10-CM | POA: Diagnosis not present

## 2023-11-12 DIAGNOSIS — G4452 New daily persistent headache (NDPH): Secondary | ICD-10-CM | POA: Diagnosis not present

## 2023-11-12 DIAGNOSIS — K8051 Calculus of bile duct without cholangitis or cholecystitis with obstruction: Secondary | ICD-10-CM

## 2023-11-12 DIAGNOSIS — K219 Gastro-esophageal reflux disease without esophagitis: Secondary | ICD-10-CM

## 2023-11-12 LAB — COMPREHENSIVE METABOLIC PANEL WITH GFR
ALT: 352 U/L — ABNORMAL HIGH (ref 0–44)
AST: 294 U/L — ABNORMAL HIGH (ref 15–41)
Albumin: 3.6 g/dL (ref 3.5–5.0)
Alkaline Phosphatase: 205 U/L — ABNORMAL HIGH (ref 38–126)
Anion gap: 11 (ref 5–15)
BUN: 7 mg/dL (ref 6–20)
CO2: 22 mmol/L (ref 22–32)
Calcium: 8.5 mg/dL — ABNORMAL LOW (ref 8.9–10.3)
Chloride: 104 mmol/L (ref 98–111)
Creatinine, Ser: 0.69 mg/dL (ref 0.44–1.00)
GFR, Estimated: >60 mL/min (ref >60)
Glucose, Bld: 114 mg/dL — ABNORMAL HIGH (ref 70–99)
Potassium: 3.2 mmol/L — ABNORMAL LOW (ref 3.5–5.1)
Sodium: 137 mmol/L (ref 135–145)
Total Bilirubin: 4.1 mg/dL — ABNORMAL HIGH (ref 0.0–1.2)
Total Protein: 6.2 g/dL — ABNORMAL LOW (ref 6.5–8.1)

## 2023-11-12 LAB — CBC
HCT: 41.2 % (ref 36.0–46.0)
Hemoglobin: 14.2 g/dL (ref 12.0–15.0)
MCH: 31.2 pg (ref 26.0–34.0)
MCHC: 34.5 g/dL (ref 30.0–36.0)
MCV: 90.5 fL (ref 80.0–100.0)
Platelets: 147 K/uL — ABNORMAL LOW (ref 150–400)
RBC: 4.55 MIL/uL (ref 3.87–5.11)
RDW: 13.8 % (ref 11.5–15.5)
WBC: 8.6 K/uL (ref 4.0–10.5)
nRBC: 0 % (ref 0.0–0.2)

## 2023-11-12 LAB — HIV ANTIBODY (ROUTINE TESTING W REFLEX): HIV Screen 4th Generation wRfx: NONREACTIVE

## 2023-11-12 LAB — MAGNESIUM: Magnesium: 1.8 mg/dL (ref 1.7–2.4)

## 2023-11-12 MED ORDER — NALOXONE HCL 0.4 MG/ML IJ SOLN: 0.4000 mg | INTRAMUSCULAR | Status: DC | PRN

## 2023-11-12 MED ORDER — HYDRALAZINE HCL 20 MG/ML IJ SOLN: 5.0000 mg | Freq: Four times a day (QID) | INTRAMUSCULAR | Status: DC | PRN

## 2023-11-12 MED ORDER — OXYCODONE HCL 5 MG PO TABS
5.0000 mg | ORAL_TABLET | Freq: Four times a day (QID) | ORAL | Status: AC | PRN
  Administered 2023-11-14: 5 mg via ORAL
  Filled 2023-11-12: qty 1

## 2023-11-12 MED ORDER — LOSARTAN POTASSIUM 50 MG PO TABS
25.0000 mg | ORAL_TABLET | Freq: Every day | ORAL | Status: DC
Start: 2023-11-12 — End: 2023-11-13
  Administered 2023-11-12 – 2023-11-13 (×2): 25 mg via ORAL
  Filled 2023-11-12 (×2): qty 1

## 2023-11-12 MED ORDER — SODIUM CHLORIDE 0.9 % IV SOLN: INTRAVENOUS | Status: AC

## 2023-11-12 MED ORDER — IBUPROFEN 200 MG PO TABS
400.0000 mg | ORAL_TABLET | Freq: Four times a day (QID) | ORAL | Status: DC | PRN
  Administered 2023-11-12 – 2023-11-13 (×2): 400 mg via ORAL
  Filled 2023-11-12 (×2): qty 2

## 2023-11-12 MED ORDER — MORPHINE SULFATE (PF) 2 MG/ML IV SOLN
1.0000 mg | INTRAVENOUS | Status: DC | PRN
  Administered 2023-11-14: 1 mg via INTRAVENOUS
  Filled 2023-11-12: qty 1

## 2023-11-12 MED ORDER — PIPERACILLIN-TAZOBACTAM 3.375 G IVPB
3.3750 g | Freq: Three times a day (TID) | INTRAVENOUS | Status: DC
  Administered 2023-11-12 – 2023-11-15 (×10): 3.375 g via INTRAVENOUS
  Filled 2023-11-12 (×9): qty 50

## 2023-11-12 MED ORDER — GADOBUTROL 1 MMOL/ML IV SOLN
7.0000 mL | Freq: Once | INTRAVENOUS | Status: AC | PRN
  Administered 2023-11-12: 7 mL via INTRAVENOUS

## 2023-11-12 MED ORDER — LORAZEPAM 0.5 MG PO TABS
0.5000 mg | ORAL_TABLET | Freq: Once | ORAL | Status: AC
  Administered 2023-11-12: 0.5 mg via ORAL
  Filled 2023-11-12: qty 1

## 2023-11-12 MED ORDER — POTASSIUM CHLORIDE CRYS ER 20 MEQ PO TBCR
40.0000 meq | EXTENDED_RELEASE_TABLET | Freq: Once | ORAL | Status: AC
  Administered 2023-11-12: 40 meq via ORAL
  Filled 2023-11-12: qty 2

## 2023-11-12 NOTE — Progress Notes (Signed)
 PROGRESS NOTE    Ruth Cooper  FMW:[id number] DOB: June 01, 1970 DOA: 11/11/2023 PCP: Watt Harlene BROCKS, MD   Brief Narrative: Ruth Cooper is a 53 y.o. female with a history of hypertension, vertebral artery dissection, cholelithiasis.  Patient presented secondary to abdominal pain and found to have concern for choledocholithiasis. GI consulted.   Assessment and Plan:  Possible choledocholithiasis Patient with postprandial epigastric abdominal pain with associated elevated AST, ALT, alkaline phosphatase and total bilirubin. RUQ ultrasound significant for hepatic steatosis and mildly distended common bile duct measuring 8.2 mm. GI consulted with plan for ERCP. Zosyn  started. -Trend CMP -Continue Zosyn   Primary hypertension Restart home losartan   GERD Continue Protonix    DVT prophylaxis: SCDs Code Status:   Code Status: Full Code Family Communication: Mother and Father at bedside Disposition Plan: Discharge pending ongoing GI recommendations   Consultants:  Gastroenterology  Procedures:  None  Antimicrobials: Zosyn  IV    Subjective: Patient reports ongoing post-prandial abdominal pain and fullness. No other issues.  Objective: BP (!) 145/83 (BP Location: Left Arm)   Pulse 70   Temp 98.2 F (36.8 C)   Resp 18   Ht 5' 6 (1.676 m)   Wt 75.9 kg   SpO2 98%   BMI 27.01 kg/m   Examination:  General exam: Appears calm and comfortable Respiratory system: Clear to auscultation. Respiratory effort normal. Cardiovascular system: S1 & S2 heard, RRR. No murmurs, rubs, gallops or clicks. Gastrointestinal system: Abdomen is nondistended, soft and nontender. Normal bowel sounds heard. Central nervous system: Alert and oriented. No focal neurological deficits. Musculoskeletal: No edema. No calf tenderness Skin: No cyanosis. No rashes Psychiatry: Judgement and insight appear normal. Mood & affect appropriate.    Data Reviewed: I have personally  reviewed following labs and imaging studies  CBC Lab Results  Component Value Date   WBC 8.6 11/12/2023   RBC 4.55 11/12/2023   HGB 14.2 11/12/2023   HCT 41.2 11/12/2023   MCV 90.5 11/12/2023   MCH 31.2 11/12/2023   PLT 147 (L) 11/12/2023   MCHC 34.5 11/12/2023   RDW 13.8 11/12/2023   LYMPHSABS 0.3 (L) 11/11/2023   MONOABS 0.3 11/11/2023   EOSABS 0.1 11/11/2023   BASOSABS 0.0 11/11/2023     Last metabolic panel Lab Results  Component Value Date   NA 137 11/12/2023   K 3.2 (L) 11/12/2023   CL 104 11/12/2023   CO2 22 11/12/2023   BUN 7 11/12/2023   CREATININE 0.69 11/12/2023   GLUCOSE 114 (H) 11/12/2023   GFRNONAA >60 11/12/2023   CALCIUM 8.5 (L) 11/12/2023   PROT 6.2 (L) 11/12/2023   ALBUMIN 3.6 11/12/2023   BILITOT 4.1 (H) 11/12/2023   ALKPHOS 205 (H) 11/12/2023   AST 294 (H) 11/12/2023   ALT 352 (H) 11/12/2023   ANIONGAP 11 11/12/2023    GFR: Estimated Creatinine Clearance: 84.6 mL/min (by C-G formula based on SCr of 0.69 mg/dL).  No results found for this or any previous visit (from the past 240 hours).    Radiology Studies: US  Abdomen Limited RUQ (LIVER/GB) Result Date: 11/11/2023 EXAM: Right Upper Quadrant Abdominal Ultrasound TECHNIQUE: Real-time ultrasonography of the right upper quadrant of the abdomen was performed. COMPARISON: None. CLINICAL HISTORY: Pain. FINDINGS: LIVER: The liver demonstrates increased echogenicity. Intrahepatic biliary ductal dilatation is present. No discrete lesions are visualized. BILIARY SYSTEM: Gallbladder wall thickness is within normal limits at 1.4 mm. No evidence of pericholecystic fluid. No cholelithiasis. Negative sonographic Murphy's sign. Common bile duct is mildly distended  measuring 8.2 mm. RIGHT KIDNEY: The right kidney is grossly unremarkable in appearances without evidence of hydronephrosis, echogenic calculi or worrisome mass lesions. PANCREAS: Visualized portions of the pancreas are unremarkable. OTHER: No right upper  quadrant ascites. IMPRESSION: 1. Mildly distended common bile duct measuring 8.2 mm with intrahepatic biliary dilation. his suggests a distal obstruction. No obstructing lesion is visualized. 2. Hepatic steatosis. Electronically signed by: Lonni Necessary MD 11/11/2023 06:27 PM EDT RP Workstation: [id number]      LOS: 1 day    Elgin Lam, MD [facility] 11/12/2023, 10:10 AM   If 7PM-7AM, please contact night-coverage [email or web address]

## 2023-11-12 NOTE — Plan of Care (Signed)

## 2023-11-12 NOTE — Hospital Course (Addendum)
 Ruth Cooper is a 53 y.o. female with a history of hypertension, vertebral artery dissection, cholelithiasis.  Patient presented secondary to abdominal pain and found to have concern for choledocholithiasis. GI consulted. MRCP confirmed choledocholithiasis with gallbladder wall thickening. General surgery consulted for cholecystectomy.

## 2023-11-12 NOTE — H&P (Signed)
 History and Physical    Ruth Cooper FMW:[id number] DOB: 1971/02/22 DOA: 11/11/2023  PCP: Watt Harlene BROCKS, MD  Patient coming from: [facility] ED  Chief Complaint: Abdominal pain  HPI: Ruth Cooper is a 53 y.o. female with medical history significant of hypertension, vertebral artery dissection, cholelithiasis presenting with a chief complaint of epigastric abdominal pain.  Patient is reporting 10-day history of postprandial epigastric abdominal pain.  Yesterday she had a muffin for breakfast and experienced some epigastric abdominal pain afterwards but then later she had a [employer] sandwich for lunch after which her pain became much worse prompting her to come into the ED to be evaluated.  No nausea or vomiting but does report heartburn.  At present, she is not having any abdominal pain.  Denies fevers or chills.  She is having regular bowel movements.  She does report history of similar abdominal pain in December 2022 and had an ultrasound done at that time which was showing gallstones.  Denies chest pain or shortness of breath.  Patient states she just started having her monthly menstrual cycle and is requesting a medication for mild headache.  ED Course: Slightly hypertensive but remainder of vital signs stable.  WBC count 10.7, AST 564, ALT 508, alk phos 284, T. bili 1.9, lipase normal, UA not suggestive of infection.  Right upper quadrant abdominal ultrasound showing hepatic steatosis, mildly distended common bile duct measuring 8.2 mm with intrahepatic biliary dilation suggesting a distal obstruction although no obstructing lesion visualized.  Patient was given morphine , Pepcid , Zosyn , and 1 L normal saline.  ED physician discussed the case with on-call provider for [facility].  Currently no ERCP coverage over the weekend but GI will see the patient in the morning and arrange for ERCP on Monday.  Review of Systems:  Review of Systems  All other systems reviewed and are  negative.   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    Past Surgical History:  Procedure Laterality Date   BACK SURGERY  12/08/2017   cervical disc    CERVICAL DISC ARTHROPLASTY  2020   ENDOMETRIAL ABLATION  2020   TONSILLECTOMY  1997   WISDOM TOOTH EXTRACTION       reports that she has never smoked. She has never used smokeless tobacco. She reports that she does not drink alcohol and does not use drugs.  No Known Allergies  Family History  Problem Relation Age of Onset   Colon cancer Mother        Living   Breast cancer Mother 21       again @ 38 - BrCA +   Healthy Father        Living   Healthy Daughter        x1   Breast cancer Maternal Grandmother 34   Colon cancer Maternal Grandfather    Alzheimer's disease Paternal Grandmother    Heart disease Paternal Grandfather    Hypertension Paternal Grandfather    Healthy Brother        x1   Healthy Son        x2   Allergies Son    Esophageal cancer Neg Hx    Stomach cancer Neg Hx    Rectal cancer Neg Hx     Prior to Admission medications   Medication Sig Start Date End Date Taking? Authorizing Provider  losartan  (COZAAR )  25 MG tablet TAKE 1 TABLET (25 MG TOTAL) BY MOUTH DAILY. 10/24/23   Copland, Jessica C, MD  MELATONIN PO Take by mouth. Patient not taking: Reported on 09/22/2023    [provider]  traZODone  (DESYREL ) 50 MG tablet Take 0.5-1 tablets (25-50 mg total) by mouth at bedtime as needed for sleep. 10/14/23   CoplandHarlene BROCKS, MD    Physical Exam: Vitals:   11/11/23 2000 11/11/23 2030 11/11/23 2225 11/11/23 2336  BP: (!) 155/82 (!) 150/89 (!) 156/95 134/82  Pulse: 88 88 87 79  Resp: 16 19 15 16   Temp:   98.6 F (37 C) 98.7 F (37.1 C)  TempSrc:   Oral Oral  SpO2: 100% 98% 98% 98%  Weight:      Height:        Physical Exam Vitals reviewed.  Constitutional:      General:  She is not in acute distress. HENT:     Head: Normocephalic and atraumatic.  Eyes:     Extraocular Movements: Extraocular movements intact.  Cardiovascular:     Rate and Rhythm: Normal rate and regular rhythm.     Heart sounds: Normal heart sounds.  Pulmonary:     Effort: Pulmonary effort is normal. No respiratory distress.     Breath sounds: Normal breath sounds.  Abdominal:     General: Bowel sounds are normal. There is no distension.     Palpations: Abdomen is soft.     Tenderness: There is no abdominal tenderness. There is no guarding.  Musculoskeletal:     Cervical back: Normal range of motion.     Right lower leg: No edema.     Left lower leg: No edema.  Skin:    General: Skin is warm and dry.  Neurological:     General: No focal deficit present.     Mental Status: She is alert and oriented to person, place, and time.     Labs on Admission: I have personally reviewed following labs and imaging studies  CBC: Recent Labs  Lab 11/11/23 1750  WBC 10.7*  NEUTROABS 9.8*  HGB 14.6  HCT 43.5  MCV 91.0  PLT 163   Basic Metabolic Panel: Recent Labs  Lab 11/11/23 1750  NA 140  K 3.7  CL 104  CO2 24  GLUCOSE 139*  BUN 12  CREATININE 0.80  CALCIUM 9.0   GFR: Estimated Creatinine Clearance: 84.6 mL/min (by C-G formula based on SCr of 0.8 mg/dL). Liver Function Tests: Recent Labs  Lab 11/11/23 1750  AST 564*  ALT 508*  ALKPHOS 284*  BILITOT 1.9*  PROT 7.0  ALBUMIN 4.6   Recent Labs  Lab 11/11/23 1750  LIPASE 29   No results for input(s): AMMONIA in the last 168 hours. Coagulation Profile: No results for input(s): INR, PROTIME in the last 168 hours. Cardiac Enzymes: No results for input(s): CKTOTAL, CKMB, CKMBINDEX, TROPONINI in the last 168 hours. BNP (last 3 results) No results for input(s): PROBNP in the last 8760 hours. HbA1C: No results for input(s): HGBA1C in the last 72 hours. CBG: No results for input(s): GLUCAP in the  last 168 hours. Lipid Profile: No results for input(s): CHOL, HDL, LDLCALC, TRIG, CHOLHDL, LDLDIRECT in the last 72 hours. Thyroid  Function Tests: No results for input(s): TSH, T4TOTAL, FREET4, T3FREE, THYROIDAB in the last 72 hours. Anemia Panel: No results for input(s): VITAMINB12, FOLATE, FERRITIN, TIBC, IRON, RETICCTPCT in the last 72 hours. Urine analysis:    Component Value Date/Time  COLORURINE YELLOW 11/11/2023 1737   APPEARANCEUR CLEAR 11/11/2023 1737   LABSPEC 1.020 11/11/2023 1737   PHURINE 7.5 11/11/2023 1737   GLUCOSEU NEGATIVE 11/11/2023 1737   GLUCOSEU NEGATIVE 10/31/2015 0914   HGBUR MODERATE (A) 11/11/2023 1737   BILIRUBINUR SMALL (A) 11/11/2023 1737   KETONESUR NEGATIVE 11/11/2023 1737   PROTEINUR NEGATIVE 11/11/2023 1737   UROBILINOGEN 0.2 10/31/2015 0914   NITRITE NEGATIVE 11/11/2023 1737   LEUKOCYTESUR NEGATIVE 11/11/2023 1737    Radiological Exams on Admission: US  Abdomen Limited RUQ (LIVER/GB) Result Date: 11/11/2023 EXAM: Right Upper Quadrant Abdominal Ultrasound TECHNIQUE: Real-time ultrasonography of the right upper quadrant of the abdomen was performed. COMPARISON: None. CLINICAL HISTORY: Pain. FINDINGS: LIVER: The liver demonstrates increased echogenicity. Intrahepatic biliary ductal dilatation is present. No discrete lesions are visualized. BILIARY SYSTEM: Gallbladder wall thickness is within normal limits at 1.4 mm. No evidence of pericholecystic fluid. No cholelithiasis. Negative sonographic Murphy's sign. Common bile duct is mildly distended measuring 8.2 mm. RIGHT KIDNEY: The right kidney is grossly unremarkable in appearances without evidence of hydronephrosis, echogenic calculi or worrisome mass lesions. PANCREAS: Visualized portions of the pancreas are unremarkable. OTHER: No right upper quadrant ascites. IMPRESSION: 1. Mildly distended common bile duct measuring 8.2 mm with intrahepatic biliary dilation. his suggests a  distal obstruction. No obstructing lesion is visualized. 2. Hepatic steatosis. Electronically signed by: Lonni Necessary MD 11/11/2023 06:27 PM EDT RP Workstation: [id number]    EKG: Independently reviewed.  Sinus rhythm, no significant change since previous tracing.  Assessment and Plan  Suspected choledocholithiasis Patient with history of cholelithiasis presenting with 10-day history of worsening postprandial epigastric abdominal pain and found to have elevated liver enzymes (AST 564, ALT 508, alk phos 284, T. bili 1.9).  Lipase normal.  Right upper quadrant abdominal ultrasound showing hepatic steatosis, mildly distended common bile duct measuring 8.2 mm with intrahepatic biliary dilation suggesting a distal obstruction although no obstructing lesion visualized.  Ascending cholangitis less likely given no fever and leukocytosis is minimal.  Continue Zosyn  for now.  [facility] has been consulted and due to no ERCP coverage over the weekend, planning on ERCP on Monday.  GI will consult in the morning.  Continue IV fluid hydration, pain management.  Trend WBC count and LFTs.  Hypertension She was slightly hypertensive in the ED but blood pressure has now improved with pain management.  Currently normotensive.  IV hydralazine  PRN SBP >160.  GERD Continue Protonix .  DVT prophylaxis: SCDs Code Status: Full Code (discussed with the patient) Family Communication: No family available at this time. Consults called:  GI Level of care: Telemetry bed Admission status: It is my clinical opinion that admission to INPATIENT is reasonable and necessary because of the expectation that this patient will require hospital care that crosses at least 2 midnights to treat this condition based on the medical complexity of the problems presented.  Given the aforementioned information, the predictability of an adverse outcome is felt to be significant.  Editha Ram MD [facility]  If  7PM-7AM, please contact night-coverage [email or web address]  11/12/2023, 12:31 AM

## 2023-11-12 NOTE — Consult Note (Addendum)
 Consultation  Referring Provider: ER MD/Lemley Primary Care Physician:  Watt Harlene BROCKS, MD Primary Gastroenterologist:  Dr. Golda  Reason for Consultation: Acute epigastric pain nausea vomiting, elevated LFTs, cholelithiasis.  HPI: Ruth Cooper is a 53 y.o. 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  Patient presented to the emergency room yesterday with complaints of intermittent epigastric pain over the previous 10 days.  She said the initial episode was severe and lasted for about an hour was associated with nausea.  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  She has had some milder episodes in between and then yesterday developed acute pain after she had eaten breakfast, tried to eat lunch and had abrupt worsening of pain associated with nausea.  She says she tried to throw up but then had a sharp pain in the left side of her head so she did not force herself to vomit.  She had persistent pain until she was seen in the ER and received pain medication.  Since then she has not noticed any significant abdominal discomfort. She has been afebrile overnight and otherwise stable. Initial labs yesterday WBC 10.7/hemoglobin 14.6/hematocrit 43.5/lipase normal Sodium 140/potassium 3.7/BUN 12/creatinine 0.8 T. bili 1.9/alk phos 284/AST 564/ALT 508.  [facility] ultrasound yesterday showed evidence of hepatic steatosis normal gallbladder wall thickness, no definite cholelithiasis and CBD of 8.2 mm  Repeat labs today WBC 8.6/sodium 137/potassium 3.2 T. bili 4.1/alk phos 205/AST 294/ALT 352  During our conversation patient brought up another symptoms she had been having .  She says she has noticed intermittent sharp pains on the left side of her head since February.  At times these are intense and last for several minutes.  The pain seems to be  precipitated by anything that increases pressure i.e. coughing straining retching etc.  She has not had any evaluation for this to date.  She is concerned about having anesthesia and whether this needs to be evaluated prior to that.  Her mom does have history of a cerebral aneurysm.   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    Past Surgical History:  Procedure Laterality Date   BACK SURGERY  12/08/2017   cervical disc    CERVICAL DISC ARTHROPLASTY  2020   ENDOMETRIAL ABLATION  2020   TONSILLECTOMY  1997   WISDOM TOOTH EXTRACTION      Prior to Admission medications   Medication Sig Start Date End Date Taking? Authorizing Provider  losartan  (COZAAR ) 25 MG tablet TAKE 1 TABLET (25 MG TOTAL) BY MOUTH DAILY. 10/24/23   Copland, Jessica C, MD  MELATONIN PO Take by mouth. Patient not taking: Reported on 09/22/2023    [provider]  traZODone  (DESYREL ) 50 MG tablet Take 0.5-1 tablets (25-50 mg total) by mouth at bedtime as needed for sleep. 10/14/23   Copland, Harlene BROCKS, MD    Current Facility-Administered Medications  Medication Dose Route Frequency Provider Last Rate Last Admin   0.9 %  sodium chloride  infusion   Intravenous Continuous Alfornia Madison, MD 125 mL/hr at 11/12/23 0219 New Bag at 11/12/23 0219   hydrALAZINE  (APRESOLINE ) injection 5 mg  5 mg Intravenous Q6H PRN Alfornia Madison, MD       ibuprofen  (ADVIL ) tablet 400 mg  400  mg Oral Q6H PRN Rathore, Vasundhra, MD   400 mg at 11/12/23 0220   morphine  (PF) 2 MG/ML injection 1 mg  1 mg Intravenous Q4H PRN Alfornia Madison, MD       naloxone  (NARCAN ) injection 0.4 mg  0.4 mg Intravenous PRN Rathore, Vasundhra, MD       ondansetron  (ZOFRAN ) injection 4 mg  4 mg Intravenous Q6H PRN Sundil, Subrina, MD       oxyCODONE  (Oxy IR/ROXICODONE ) immediate release tablet 5 mg  5 mg Oral Q6H PRN Alfornia Madison, MD        pantoprazole  (PROTONIX ) EC tablet 40 mg  40 mg Oral Daily Sundil, Subrina, MD   40 mg at 11/12/23 9075   piperacillin -tazobactam (ZOSYN ) IVPB 3.375 g  3.375 g Intravenous Q8H Mattie Marvetta SQUIBB, RPH 12.5 mL/hr at 11/12/23 0926 3.375 g at 11/12/23 0926    Allergies as of 11/11/2023   (No Known Allergies)    Family History  Problem Relation Age of Onset   Colon cancer Mother        Living   Breast cancer Mother 43       again @ 54 - BrCA +   Healthy Father        Living   Healthy Daughter        x1   Breast cancer Maternal Grandmother 14   Colon cancer Maternal Grandfather    Alzheimer's disease Paternal Grandmother    Heart disease Paternal Grandfather    Hypertension Paternal Actor    Healthy Brother        x1   Healthy Son        x2   Allergies Son    Esophageal cancer Neg Hx    Stomach cancer Neg Hx    Rectal cancer Neg Hx     Social History   Socioeconomic History   Marital status: Married    Spouse name: Not on file   Number of children: 3   Years of education: Not on file   Highest education level: Bachelor's degree (e.g., BA, AB, BS)  Occupational History   Occupation: office work  Tobacco Use   Smoking status: Never   Smokeless tobacco: Never  Vaping Use   Vaping status: Never Used  Substance and Sexual Activity   Alcohol use: No   Drug use: No   Sexual activity: Not Currently  Other Topics Concern   Not on file  Social History Narrative   Patient works as Engineer, civil (consulting).        Patient currently married with 3 children, all healthy.       -- 15, 12, 9      Lives in a 2 story home.        Education: college         Social Drivers of Health   Financial Resource Strain: Patient Declined (03/24/2023)   Overall Financial Resource Strain (CARDIA)    Difficulty of Paying Living Expenses: Patient declined  Food Insecurity: No Food Insecurity (11/11/2023)   Hunger Vital Sign    Worried About Running Out of Food in the Last Year: Never  true    Ran Out of Food in the Last Year: Never true  Transportation Needs: No Transportation Needs (11/11/2023)   PRAPARE - Administrator, Civil Service (Medical): No    Lack of Transportation (Non-Medical): No  Physical Activity: Unknown (03/24/2023)   Exercise Vital Sign    Days of Exercise per Week: 0 days  Minutes of Exercise per Session: Not on file  Stress: Stress Concern Present (03/24/2023)   [employer] of Occupational Health - Occupational Stress Questionnaire    Feeling of Stress : To some extent  Social Connections: Unknown (09/13/2023)   Social Connection and Isolation Panel    Frequency of Communication with Friends and Family: Not on file    Frequency of Social Gatherings with Friends and Family: Not on file    Attends Religious Services: Not on file    Active Member of Clubs or Organizations: Not on file    Attends Banker Meetings: Not on file    Marital Status: Married  Intimate Partner Violence: Not At Risk (11/11/2023)   Humiliation, Afraid, Rape, and Kick questionnaire    Fear of Current or Ex-Partner: No    Emotionally Abused: No    Physically Abused: No    Sexually Abused: No    Review of Systems: Pertinent positive and negative review of systems were noted in the above HPI section.  All other review of systems was otherwise negative.   Physical Exam: Vital signs in last 24 hours: Temp:  [98.2 F (36.8 C)-98.7 F (37.1 C)] 98.2 F (36.8 C) (09/13 0722) Pulse Rate:  [70-101] 70 (09/13 0722) Resp:  [15-20] 18 (09/13 0722) BP: (134-168)/(82-101) 145/83 (09/13 0722) SpO2:  [97 %-100 %] 98 % (09/13 0341) Weight:  [75.9 kg] 75.9 kg (09/12 1731)   General:   Alert,  Well-developed, well-nourished, female pleasant and cooperative in NAD, family at bedside Head:  Normocephalic and atraumatic. Eyes:  Sclera icterus   conjunctiva pink. Ears:  Normal auditory acuity. Nose:  No deformity, discharge,  or lesions. Mouth:  No  deformity or lesions.   Neck:  Supple; no masses or thyromegaly. Lungs:  Clear throughout to auscultation.   No wheezes, crackles, or rhonchi.  Heart:  Regular rate and rhythm; no murmurs, clicks, rubs,  or gallops. Abdomen:  Soft, currently nontender,, BS active,nonpalp mass or hsm.   Rectal: Not done Msk:  Symmetrical without gross deformities. . Pulses:  Normal pulses noted. Extremities:  Without clubbing or edema. Neurologic:  Alert and  oriented x4;  grossly normal neurologically. Skin:  Intact without significant lesions or rashes.. Psych:  Alert and cooperative. Normal mood and affect.  Intake/Output from previous day: 09/12 0701 - 09/13 0700 In: 1003.9 [IV Piggyback:1003.9] Out: -  Intake/Output this shift: No intake/output data recorded.  Lab Results: Recent Labs    11/11/23 1750 11/12/23 0414  WBC 10.7* 8.6  HGB 14.6 14.2  HCT 43.5 41.2  PLT 163 147*   BMET Recent Labs    11/11/23 1750 11/12/23 0414  NA 140 137  K 3.7 3.2*  CL 104 104  CO2 24 22  GLUCOSE 139* 114*  BUN 12 7  CREATININE 0.80 0.69  CALCIUM 9.0 8.5*   LFT Recent Labs    11/12/23 0414  PROT 6.2*  ALBUMIN 3.6  AST 294*  ALT 352*  ALKPHOS 205*  BILITOT 4.1*   PT/INR No results for input(s): LABPROT, INR in the last 72 hours. Hepatitis Panel No results for input(s): HEPBSAG, HCVAB, HEPAIGM, HEPBIGM in the last 72 hours.    IMPRESSION:  #36 53 year old white female with 10-day history of intermittent epigastric pain.  Initial episode 10 days ago was severe, less severe episodes in between and then worsened pain again yesterday postprandially with nausea and attempt to vomit.  Pain lasted a couple of hours until she received analgesic in the  emergency room.  She had previously documented cholelithiasis from ultrasound in 2022. Ultrasound yesterday showed normal gallbladder wall thickening no definite gallstones but CBD dilated at 8.2 mm also hepatic steatosis.  LFTs  elevated, bili on the rise today  Primary concern is for choledocholithiasis.  #2 history of hypertension #3 previous bilateral vertebral artery dissections trauma related  #4 intermittent left-sided head pain since February 2025 exacerbated by coughing sneezing straining etc. No outpatient evaluation for this.  Plan; full liquid to low-fat diet as tolerates Scheduled for MRI/MRCP today Trend LFTs, check INR  I have messaged the hospitalist regarding CT imaging of the brain.  We discussed potential ERCP with stone extraction, we reviewed biliary images.  There is no ERCP availability this weekend, and patient is aware.  Will wait for MRI MRCP, if there is cholelithiasis noted, then she will also need surgical consultation for probable laparoscopic cholecystectomy the same admission.  GI will follow with you    Amy Esterwood PA-C 11/12/2023, 11:31 AM   Attending physician's note  I personally saw the patient and performed a substantive portion of the medical decision making process for this encounter (including a complete performance of the key components : MDM, Hx and Exam), in conjunction with the APP.  I agree with the APP's note, impression, and  the management plan for the number and complexity of problems addressed at the encounter for the patient and take responsibility for that plan with its inherent risk of complications, morbidity, or mortality with additional input as follows.    53 year old female with history of hypertension, known gallstones admitted with acute epigastric abdominal pain, nausea /vomiting, elevated LFTs  On exam no tenderness or distention, normal bowel sounds  Patient complains of left sided headache with minimal strain, with worse when she was vomiting.  She has history of vertebral artery dissection and also family history of cerebral aneurysm.  She will need further workup with imaging and possible neurology consult to exclude any intracranial  lesion  Abdominal ultrasound with cholelithiasis and dilated CBD to 8.2 mm, will obtain MRCP to better visualize the CBD exclude choledocholithiasis  Continue diet as tolerated Follow-up daily LFT Follow-up MRI MRCP  Will tentatively plan for ERCP early next week, no ERCP coverage this weekend Will need brain imaging and neurologic workup  GI will continue to follow along    High complex decision making (this includes chart review, review of results, face-to-face time used for counseling as well as treatment plan and follow-up. The patient was provided an opportunity to ask questions and all were answered. The patient agreed with the plan and demonstrated an understanding of the instructions.  LOIS Wilkie Mcgee , MD [phone]

## 2023-11-13 ENCOUNTER — Inpatient Hospital Stay (HOSPITAL_COMMUNITY)

## 2023-11-13 DIAGNOSIS — K8050 Calculus of bile duct without cholangitis or cholecystitis without obstruction: Secondary | ICD-10-CM | POA: Diagnosis not present

## 2023-11-13 DIAGNOSIS — K8070 Calculus of gallbladder and bile duct without cholecystitis without obstruction: Secondary | ICD-10-CM | POA: Diagnosis not present

## 2023-11-13 DIAGNOSIS — G4452 New daily persistent headache (NDPH): Secondary | ICD-10-CM | POA: Diagnosis not present

## 2023-11-13 LAB — CBC WITH DIFFERENTIAL/PLATELET
Abs Immature Granulocytes: 0.01 K/uL (ref 0.00–0.07)
Basophils Absolute: 0.1 K/uL (ref 0.0–0.1)
Basophils Relative: 1 %
Eosinophils Absolute: 0.3 K/uL (ref 0.0–0.5)
Eosinophils Relative: 5 %
HCT: 42.8 % (ref 36.0–46.0)
Hemoglobin: 14.6 g/dL (ref 12.0–15.0)
Immature Granulocytes: 0 %
Lymphocytes Relative: 11 %
Lymphs Abs: 0.6 K/uL — ABNORMAL LOW (ref 0.7–4.0)
MCH: 31.2 pg (ref 26.0–34.0)
MCHC: 34.1 g/dL (ref 30.0–36.0)
MCV: 91.5 fL (ref 80.0–100.0)
Monocytes Absolute: 0.6 K/uL (ref 0.1–1.0)
Monocytes Relative: 11 %
Neutro Abs: 3.6 K/uL (ref 1.7–7.7)
Neutrophils Relative %: 72 %
Platelets: 145 K/uL — ABNORMAL LOW (ref 150–400)
RBC: 4.68 MIL/uL (ref 3.87–5.11)
RDW: 14.1 % (ref 11.5–15.5)
WBC: 5 K/uL (ref 4.0–10.5)
nRBC: 0 % (ref 0.0–0.2)

## 2023-11-13 LAB — COMPREHENSIVE METABOLIC PANEL WITH GFR
ALT: 264 U/L — ABNORMAL HIGH (ref 0–44)
AST: 150 U/L — ABNORMAL HIGH (ref 15–41)
Albumin: 3.6 g/dL (ref 3.5–5.0)
Alkaline Phosphatase: 217 U/L — ABNORMAL HIGH (ref 38–126)
Anion gap: 9 (ref 5–15)
BUN: 5 mg/dL — ABNORMAL LOW (ref 6–20)
CO2: 22 mmol/L (ref 22–32)
Calcium: 8.9 mg/dL (ref 8.9–10.3)
Chloride: 107 mmol/L (ref 98–111)
Creatinine, Ser: 0.79 mg/dL (ref 0.44–1.00)
GFR, Estimated: >60 mL/min (ref >60)
Glucose, Bld: 94 mg/dL (ref 70–99)
Potassium: 3.5 mmol/L (ref 3.5–5.1)
Sodium: 138 mmol/L (ref 135–145)
Total Bilirubin: 3 mg/dL — ABNORMAL HIGH (ref 0.0–1.2)
Total Protein: 6.7 g/dL (ref 6.5–8.1)

## 2023-11-13 LAB — PROTIME-INR
INR: 1 (ref 0.8–1.2)
Prothrombin Time: 13.6 s (ref 11.4–15.2)

## 2023-11-13 MED ORDER — LOSARTAN POTASSIUM 50 MG PO TABS
50.0000 mg | ORAL_TABLET | Freq: Every day | ORAL | Status: DC
Start: 2023-11-14 — End: 2023-11-15
  Administered 2023-11-14: 50 mg via ORAL
  Filled 2023-11-13: qty 1

## 2023-11-13 MED ORDER — LOSARTAN POTASSIUM 50 MG PO TABS
25.0000 mg | ORAL_TABLET | Freq: Once | ORAL | Status: AC
  Administered 2023-11-13: 25 mg via ORAL
  Filled 2023-11-13: qty 1

## 2023-11-13 MED ORDER — LORAZEPAM 0.5 MG PO TABS: 0.5000 mg | ORAL_TABLET | Freq: Once | ORAL | Status: DC

## 2023-11-13 MED ORDER — GADOBUTROL 1 MMOL/ML IV SOLN
7.5000 mL | Freq: Once | INTRAVENOUS | Status: AC | PRN
  Administered 2023-11-13: 7.5 mL via INTRAVENOUS

## 2023-11-13 MED ORDER — MIDAZOLAM HCL 2 MG/2ML IJ SOLN
2.0000 mg | Freq: Once | INTRAMUSCULAR | Status: AC | PRN
  Administered 2023-11-13: 2 mg via INTRAVENOUS
  Filled 2023-11-13: qty 2

## 2023-11-13 NOTE — Plan of Care (Signed)

## 2023-11-13 NOTE — Anesthesia Preprocedure Evaluation (Addendum)
 Anesthesia Evaluation  Patient identified by MRN, date of birth, ID band Patient awake    Reviewed: Allergy & Precautions, NPO status , Patient's Chart, lab work & pertinent test results  History of Anesthesia Complications Negative for: history of anesthetic complications  Airway Mallampati: I  TM Distance: >3 FB Neck ROM: Full    Dental  (+) Dental Advisory Given   Pulmonary    breath sounds clear to auscultation       Cardiovascular hypertension, Pt. on medications + Peripheral Vascular Disease (Hx of Vertebral Dissection x 2)   Rhythm:Regular Rate:Normal  TTE (2025):  1. LVEF 55 to 60%. No RWMA.  2. RV function normal.    3. The mitral valve is normal. No evidence of mitral stenosis.   4. The aortic valve is normal. No aortic stenosis is present.   5. Ascending aorta mildly dilated, measuring 40 mm.    Neuro/Psych  Headaches Hx of Cervical Spine Arthroplasty    GI/Hepatic ,GERD  ,,ALT 264, AST 150 (9/14)   Endo/Other    Renal/GU      Musculoskeletal   Abdominal   Peds  Hematology Thrombocytopenia Hgb 14.6, Plts 145k (9/14)   Anesthesia Other Findings   Reproductive/Obstetrics                              Anesthesia Physical Anesthesia Plan  ASA: 2  Anesthesia Plan: General   Post-op Pain Management:    Induction: Intravenous  PONV Risk Score and Plan: 3  Airway Management Planned: Video Laryngoscope Planned  Additional Equipment:   Intra-op Plan:   Post-operative Plan: Extubation in OR  Informed Consent:      Dental advisory given  Plan Discussed with: CRNA  Anesthesia Plan Comments:          Anesthesia Quick Evaluation

## 2023-11-13 NOTE — Progress Notes (Signed)
 [facility] ROUNDING NOTE   Subjective:  Feels fine, denies any abdominal pain, nausea or vomiting Tolerating full liquid diet   Objective: Vital signs in last 24 hours: Temp:  [97.6 F (36.4 C)-98.7 F (37.1 C)] 98.1 F (36.7 C) (09/14 0758) Pulse Rate:  [67-81] 81 (09/14 0758) Resp:  [16-20] 17 (09/14 0758) BP: (145-153)/(82-95) 152/91 (09/14 0758) SpO2:  [96 %-100 %] 97 % (09/14 0758) Last BM Date : 11/12/23 General: NAD Abdomen: Soft, no tenderness or distention, normal bowel sounds Ext: No edema    Intake/Output from previous day: 09/13 0701 - 09/14 0700 In: 150 [IV Piggyback:150] Out: -  Intake/Output this shift: No intake/output data recorded.   Lab Results: Recent Labs    11/11/23 1750 11/12/23 0414 11/13/23 0521  WBC 10.7* 8.6 5.0  HGB 14.6 14.2 14.6  PLT 163 147* 145*  MCV 91.0 90.5 91.5   BMET Recent Labs    11/11/23 1750 11/12/23 0414 11/13/23 0521  NA 140 137 138  K 3.7 3.2* 3.5  CL 104 104 107  CO2 24 22 22   GLUCOSE 139* 114* 94  BUN 12 7 5*  CREATININE 0.80 0.69 0.79  CALCIUM 9.0 8.5* 8.9   LFT Recent Labs    11/11/23 1750 11/12/23 0414 11/13/23 0521  PROT 7.0 6.2* 6.7  ALBUMIN 4.6 3.6 3.6  AST 564* 294* 150*  ALT 508* 352* 264*  ALKPHOS 284* 205* 217*  BILITOT 1.9* 4.1* 3.0*   PT/INR Recent Labs    11/13/23 0521  INR 1.0      Imaging/Other results: MR ABDOMEN MRCP W WO CONTAST Result Date: 11/13/2023 EXAM: MRCP WITH AND WITHOUT IV CONTRAST 11/12/2023 03:11:17 PM TECHNIQUE: Multisequence, multiplanar magnetic resonance images of the abdomen with and without intravenous contrast. MRCP sequences were performed. COMPARISON: Ultrasound 11/11/2023. CLINICAL HISTORY: Biliary obstruction suspected (Ped 0-17y); Probable common bile duct stones. FINDINGS: LIVER: No enhancing liver lesions identified. Diffuse periportal edema. GALLBLADDER AND BILIARY SYSTEM: Multiple gallstones are identified measuring around 6 mm,  image 34/4. There is diffuse gallbladder wall enhancement and trace pericholecystic fluid. No significant gallbladder wall thickening. Dilatation of the cystic duct and common bile duct is noted with moderate intrahepatic bile duct dilatation. The common bile duct measures up to 1.2 cm. Numerous stones are identified throughout the common bile duct all measuring up to 8 mm. SPLEEN: Unremarkable. PANCREAS/PANCREATIC DUCT: Normal appearance of the pancreas without signs of main duct dilatation, inflammation or mass. ADRENAL GLANDS: Unremarkable. KIDNEYS: Unremarkable. LYMPH NODES: No enlarged abdominal lymph nodes. VASULATURE: Unremarkable. PERITONEUM: No ascites. ABDOMINAL WALL: No hernia. No mass. BOWEL: Grossly unremarkable. No bowel obstruction. BONES: No enhancing bone lesions. SOFT TISSUES: Unremarkable. MISCELLANEOUS: Unremarkable. IMPRESSION: 1. Multiple gallstones and numerous stones throughout the common bile duct, with associated dilatation of the cystic duct and common bile duct (up to 1.2 cm) and moderate intrahepatic bile duct dilatation. 2. Diffuse gallbladder wall enhancement and trace pericholecystic fluid without significant gallbladder wall thickening. 3. Diffuse periportal edema. Electronically signed by: Waddell Calk MD 11/13/2023 05:12 AM EDT RP Workstation: [id number]   MR 3D Recon At Scanner Result Date: 11/13/2023 EXAM: MRCP WITH AND WITHOUT IV CONTRAST 11/12/2023 03:11:17 PM TECHNIQUE: Multisequence, multiplanar magnetic resonance images of the abdomen with and without intravenous contrast. MRCP sequences were performed. COMPARISON: Ultrasound 11/11/2023. CLINICAL HISTORY: Biliary obstruction suspected (Ped 0-17y); Probable common bile duct stones. FINDINGS: LIVER: No enhancing liver lesions identified. Diffuse periportal edema. GALLBLADDER AND BILIARY SYSTEM: Multiple gallstones are identified measuring around 6 mm, image  34/4. There is diffuse gallbladder wall enhancement and trace  pericholecystic fluid. No significant gallbladder wall thickening. Dilatation of the cystic duct and common bile duct is noted with moderate intrahepatic bile duct dilatation. The common bile duct measures up to 1.2 cm. Numerous stones are identified throughout the common bile duct all measuring up to 8 mm. SPLEEN: Unremarkable. PANCREAS/PANCREATIC DUCT: Normal appearance of the pancreas without signs of main duct dilatation, inflammation or mass. ADRENAL GLANDS: Unremarkable. KIDNEYS: Unremarkable. LYMPH NODES: No enlarged abdominal lymph nodes. VASULATURE: Unremarkable. PERITONEUM: No ascites. ABDOMINAL WALL: No hernia. No mass. BOWEL: Grossly unremarkable. No bowel obstruction. BONES: No enhancing bone lesions. SOFT TISSUES: Unremarkable. MISCELLANEOUS: Unremarkable. IMPRESSION: 1. Multiple gallstones and numerous stones throughout the common bile duct, with associated dilatation of the cystic duct and common bile duct (up to 1.2 cm) and moderate intrahepatic bile duct dilatation. 2. Diffuse gallbladder wall enhancement and trace pericholecystic fluid without significant gallbladder wall thickening. 3. Diffuse periportal edema. Electronically signed by: Waddell Calk MD 11/13/2023 05:12 AM EDT RP Workstation: [id number]   US  Abdomen Limited RUQ (LIVER/GB) Result Date: 11/11/2023 EXAM: Right Upper Quadrant Abdominal Ultrasound TECHNIQUE: Real-time ultrasonography of the right upper quadrant of the abdomen was performed. COMPARISON: None. CLINICAL HISTORY: Pain. FINDINGS: LIVER: The liver demonstrates increased echogenicity. Intrahepatic biliary ductal dilatation is present. No discrete lesions are visualized. BILIARY SYSTEM: Gallbladder wall thickness is within normal limits at 1.4 mm. No evidence of pericholecystic fluid. No cholelithiasis. Negative sonographic Murphy's sign. Common bile duct is mildly distended measuring 8.2 mm. RIGHT KIDNEY: The right kidney is grossly unremarkable in appearances without  evidence of hydronephrosis, echogenic calculi or worrisome mass lesions. PANCREAS: Visualized portions of the pancreas are unremarkable. OTHER: No right upper quadrant ascites. IMPRESSION: 1. Mildly distended common bile duct measuring 8.2 mm with intrahepatic biliary dilation. his suggests a distal obstruction. No obstructing lesion is visualized. 2. Hepatic steatosis. Electronically signed by: Lonni Necessary MD 11/11/2023 06:27 PM EDT RP Workstation: [id number]      Assessment &Plan  53 year old female with epigastric pain, nausea and vomiting in the setting of cholelithiasis and choledocholithiasis with dilated CBD  Patient will need cholecystectomy and ERCP There is no ERCP availability this weekend and tentatively procedure will be Tuesday Consult surgery for possible cholecystectomy during this admission  Advance diet to soft low fiber Monitor LFT's Will make patient n.p.o. after midnight in case if there is available to you to do ERCP tomorrow possible  History of vertebral artery dissection, family history of cerebral aneurysm, complains of unilateral recent onset headache worse with minimal strain, will order MRI brain to exclude any intracranial abnormality   This visit required >35 minutes of patient care (this includes precharting, chart review, review of results, face-to-face time used for counseling as well as treatment plan and follow-up. The patient was provided an opportunity to ask questions and all were answered. The patient agreed with the plan and demonstrated an understanding of the instructions.   LOIS Wilkie Mcgee , MD [phone]  [facility]

## 2023-11-13 NOTE — Consult Note (Signed)
 Consulting Physician: Deward PARAS Nadeem Romanoski  Referring Provider: Dr. Briana  Chief Complaint: Abdominal pain  Reason for Consult: Choledocholithiasis   Subjective   HPI: Ruth Cooper is an 53 y.o. female who is here for abdominal pain found to have choledocholithiasis.  Gastroenterology was consulted for ERCP, which will not be available for a few days, so general surgery was consulted for evaluation for cholecystectomy, possibly prior to ERCP.  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    Past Surgical History:  Procedure Laterality Date   BACK SURGERY  12/08/2017   cervical disc    CERVICAL DISC ARTHROPLASTY  2020   ENDOMETRIAL ABLATION  2020   TONSILLECTOMY  1997   WISDOM TOOTH EXTRACTION      Family History  Problem Relation Age of Onset   Colon cancer Mother        Living   Breast cancer Mother 27       again @ 88 - [facility] +   Healthy Father        Living   Healthy Daughter        x1   Breast cancer Maternal Grandmother 65   Colon cancer Maternal Grandfather    Alzheimer's disease Paternal Grandmother    Heart disease Paternal Grandfather    Hypertension Paternal Grandfather    Healthy Brother        x1   Healthy Son        x2   Allergies Son    Esophageal cancer Neg Hx    Stomach cancer Neg Hx    Rectal cancer Neg Hx     Social:  reports that she has never smoked. She has never used smokeless tobacco. She reports that she does not drink alcohol and does not use drugs.  Allergies:  Allergies  Allergen Reactions   Trazodone  Other (See Comments)    Excessive drowsiness Confusion Dizziness     Medications: Current Outpatient Medications  Medication Instructions   aspirin EC 81 mg, Daily   ibuprofen  (ADVIL ) 400 mg, 2 times daily PRN   losartan  (COZAAR ) 25 mg, Oral, Daily   traZODone  (DESYREL ) 25-50 mg, Oral, At bedtime PRN     ROS - all of the below systems have been reviewed with the patient and positives are indicated with bold text General: chills, fever or night sweats Eyes: blurry vision or double vision ENT: epistaxis or sore throat Allergy/Immunology: itchy/watery eyes or nasal congestion Hematologic/Lymphatic: bleeding problems, blood clots or swollen lymph nodes Endocrine: temperature intolerance or unexpected weight changes Breast: new or changing breast lumps or nipple discharge Resp: cough, shortness of breath, or wheezing CV: chest pain or dyspnea on exertion GI: as per HPI GU: dysuria, trouble voiding, or hematuria MSK: joint pain or joint stiffness Neuro: TIA or stroke symptoms Derm: pruritus and skin lesion changes Psych: anxiety and depression  Objective   PE Blood pressure (!) 152/91, pulse 81, temperature 98.1 F (36.7 C), temperature source Oral, resp. rate 17, height 5' 6 (1.676 m), weight 75.9 kg, SpO2 97%. Constitutional: NAD; conversant; no deformities Eyes: Moist conjunctiva; no lid lag; anicteric; PERRL Neck: Trachea midline; no thyromegaly Lungs: Normal respiratory effort; no tactile fremitus CV: RRR; no palpable thrills; no pitting edema GI: Abd Soft, nontender; no palpable hepatosplenomegaly MSK: Normal range of motion of extremities; no clubbing/cyanosis Psychiatric: Appropriate affect; alert and oriented x3  Lymphatic: No palpable cervical or axillary lymphadenopathy  Results for orders placed or performed during the hospital encounter of 11/11/23 (from the past 24 hours)  Comprehensive metabolic panel     Status: Abnormal   Collection Time: 11/13/23  5:21 AM  Result Value Ref Range   Sodium 138 135 - 145 mmol/L   Potassium 3.5 3.5 - 5.1 mmol/L   Chloride 107 98 - 111 mmol/L   CO2 22 22 - 32 mmol/L   Glucose, Bld 94 70 - 99 mg/dL   BUN 5 (L) 6 - 20 mg/dL   Creatinine, Ser 9.20 0.44 - 1.00 mg/dL   Calcium 8.9 8.9 - 89.6 mg/dL   Total Protein 6.7 6.5 - 8.1 g/dL    Albumin 3.6 3.5 - 5.0 g/dL   AST 849 (H) 15 - 41 U/L   ALT 264 (H) 0 - 44 U/L   Alkaline Phosphatase 217 (H) 38 - 126 U/L   Total Bilirubin 3.0 (H) 0.0 - 1.2 mg/dL   GFR, Estimated >39 >39 mL/min   Anion gap 9 5 - 15  Protime-INR     Status: None   Collection Time: 11/13/23  5:21 AM  Result Value Ref Range   Prothrombin Time 13.6 11.4 - 15.2 seconds   INR 1.0 0.8 - 1.2  CBC with Differential/Platelet     Status: Abnormal   Collection Time: 11/13/23  5:21 AM  Result Value Ref Range   WBC 5.0 4.0 - 10.5 K/uL   RBC 4.68 3.87 - 5.11 MIL/uL   Hemoglobin 14.6 12.0 - 15.0 g/dL   HCT 57.1 63.9 - 53.9 %   MCV 91.5 80.0 - 100.0 fL   MCH 31.2 26.0 - 34.0 pg   MCHC 34.1 30.0 - 36.0 g/dL   RDW 85.8 88.4 - 84.4 %   Platelets 145 (L) 150 - 400 K/uL   nRBC 0.0 0.0 - 0.2 %   Neutrophils Relative % 72 %   Neutro Abs 3.6 1.7 - 7.7 K/uL   Lymphocytes Relative 11 %   Lymphs Abs 0.6 (L) 0.7 - 4.0 K/uL   Monocytes Relative 11 %   Monocytes Absolute 0.6 0.1 - 1.0 K/uL   Eosinophils Relative 5 %   Eosinophils Absolute 0.3 0.0 - 0.5 K/uL   Basophils Relative 1 %   Basophils Absolute 0.1 0.0 - 0.1 K/uL   Immature Granulocytes 0 %   Abs Immature Granulocytes 0.01 0.00 - 0.07 K/uL     Imaging Orders         US  Abdomen Limited RUQ (LIVER/GB)         MR ABDOMEN MRCP W WO CONTAST         MR 3D Recon At Scanner         MR BRAIN W WO CONTRAST      Assessment and Plan   Ruth Cooper is an 53 y.o. female with choledocholithiasis.  On my review of the imaging, the choledocholithiasis is quite impressive on MRI and I think she will definitely need ERCP at some point.  I also recommend laparoscopic cholecystectomy during this admission.  We discussed the procedure, its risks, benefits and alternatives.  I discussed logistics and scheduling with Dr. Nandigam.  We will plan for laparoscopic cholecystectomy tomorrow and ERCP to follow.    ICD-10-CM   1. Choledocholithiasis  K80.50 MR 3D Recon  At Scanner    MR 3D Recon At Scanner    2. Epigastric pain  R10.13  3. LFT elevation  R79.89        Deward JINNY Foy, MD  [facility] Use [email or web address] to contact on call provider  New Patient Billing: [phone] - High MDM

## 2023-11-13 NOTE — H&P (View-Only) (Signed)
 [facility] ROUNDING NOTE   Subjective:  Feels fine, denies any abdominal pain, nausea or vomiting Tolerating full liquid diet   Objective: Vital signs in last 24 hours: Temp:  [97.6 F (36.4 C)-98.7 F (37.1 C)] 98.1 F (36.7 C) (09/14 0758) Pulse Rate:  [67-81] 81 (09/14 0758) Resp:  [16-20] 17 (09/14 0758) BP: (145-153)/(82-95) 152/91 (09/14 0758) SpO2:  [96 %-100 %] 97 % (09/14 0758) Last BM Date : 11/12/23 General: NAD Abdomen: Soft, no tenderness or distention, normal bowel sounds Ext: No edema    Intake/Output from previous day: 09/13 0701 - 09/14 0700 In: 150 [IV Piggyback:150] Out: -  Intake/Output this shift: No intake/output data recorded.   Lab Results: Recent Labs    11/11/23 1750 11/12/23 0414 11/13/23 0521  WBC 10.7* 8.6 5.0  HGB 14.6 14.2 14.6  PLT 163 147* 145*  MCV 91.0 90.5 91.5   BMET Recent Labs    11/11/23 1750 11/12/23 0414 11/13/23 0521  NA 140 137 138  K 3.7 3.2* 3.5  CL 104 104 107  CO2 24 22 22   GLUCOSE 139* 114* 94  BUN 12 7 5*  CREATININE 0.80 0.69 0.79  CALCIUM 9.0 8.5* 8.9   LFT Recent Labs    11/11/23 1750 11/12/23 0414 11/13/23 0521  PROT 7.0 6.2* 6.7  ALBUMIN 4.6 3.6 3.6  AST 564* 294* 150*  ALT 508* 352* 264*  ALKPHOS 284* 205* 217*  BILITOT 1.9* 4.1* 3.0*   PT/INR Recent Labs    11/13/23 0521  INR 1.0      Imaging/Other results: MR ABDOMEN MRCP W WO CONTAST Result Date: 11/13/2023 EXAM: MRCP WITH AND WITHOUT IV CONTRAST 11/12/2023 03:11:17 PM TECHNIQUE: Multisequence, multiplanar magnetic resonance images of the abdomen with and without intravenous contrast. MRCP sequences were performed. COMPARISON: Ultrasound 11/11/2023. CLINICAL HISTORY: Biliary obstruction suspected (Ped 0-17y); Probable common bile duct stones. FINDINGS: LIVER: No enhancing liver lesions identified. Diffuse periportal edema. GALLBLADDER AND BILIARY SYSTEM: Multiple gallstones are identified measuring around 6 mm,  image 34/4. There is diffuse gallbladder wall enhancement and trace pericholecystic fluid. No significant gallbladder wall thickening. Dilatation of the cystic duct and common bile duct is noted with moderate intrahepatic bile duct dilatation. The common bile duct measures up to 1.2 cm. Numerous stones are identified throughout the common bile duct all measuring up to 8 mm. SPLEEN: Unremarkable. PANCREAS/PANCREATIC DUCT: Normal appearance of the pancreas without signs of main duct dilatation, inflammation or mass. ADRENAL GLANDS: Unremarkable. KIDNEYS: Unremarkable. LYMPH NODES: No enlarged abdominal lymph nodes. VASULATURE: Unremarkable. PERITONEUM: No ascites. ABDOMINAL WALL: No hernia. No mass. BOWEL: Grossly unremarkable. No bowel obstruction. BONES: No enhancing bone lesions. SOFT TISSUES: Unremarkable. MISCELLANEOUS: Unremarkable. IMPRESSION: 1. Multiple gallstones and numerous stones throughout the common bile duct, with associated dilatation of the cystic duct and common bile duct (up to 1.2 cm) and moderate intrahepatic bile duct dilatation. 2. Diffuse gallbladder wall enhancement and trace pericholecystic fluid without significant gallbladder wall thickening. 3. Diffuse periportal edema. Electronically signed by: Waddell Calk MD 11/13/2023 05:12 AM EDT RP Workstation: [id number]   MR 3D Recon At Scanner Result Date: 11/13/2023 EXAM: MRCP WITH AND WITHOUT IV CONTRAST 11/12/2023 03:11:17 PM TECHNIQUE: Multisequence, multiplanar magnetic resonance images of the abdomen with and without intravenous contrast. MRCP sequences were performed. COMPARISON: Ultrasound 11/11/2023. CLINICAL HISTORY: Biliary obstruction suspected (Ped 0-17y); Probable common bile duct stones. FINDINGS: LIVER: No enhancing liver lesions identified. Diffuse periportal edema. GALLBLADDER AND BILIARY SYSTEM: Multiple gallstones are identified measuring around 6 mm, image  34/4. There is diffuse gallbladder wall enhancement and trace  pericholecystic fluid. No significant gallbladder wall thickening. Dilatation of the cystic duct and common bile duct is noted with moderate intrahepatic bile duct dilatation. The common bile duct measures up to 1.2 cm. Numerous stones are identified throughout the common bile duct all measuring up to 8 mm. SPLEEN: Unremarkable. PANCREAS/PANCREATIC DUCT: Normal appearance of the pancreas without signs of main duct dilatation, inflammation or mass. ADRENAL GLANDS: Unremarkable. KIDNEYS: Unremarkable. LYMPH NODES: No enlarged abdominal lymph nodes. VASULATURE: Unremarkable. PERITONEUM: No ascites. ABDOMINAL WALL: No hernia. No mass. BOWEL: Grossly unremarkable. No bowel obstruction. BONES: No enhancing bone lesions. SOFT TISSUES: Unremarkable. MISCELLANEOUS: Unremarkable. IMPRESSION: 1. Multiple gallstones and numerous stones throughout the common bile duct, with associated dilatation of the cystic duct and common bile duct (up to 1.2 cm) and moderate intrahepatic bile duct dilatation. 2. Diffuse gallbladder wall enhancement and trace pericholecystic fluid without significant gallbladder wall thickening. 3. Diffuse periportal edema. Electronically signed by: Waddell Calk MD 11/13/2023 05:12 AM EDT RP Workstation: [id number]   US  Abdomen Limited RUQ (LIVER/GB) Result Date: 11/11/2023 EXAM: Right Upper Quadrant Abdominal Ultrasound TECHNIQUE: Real-time ultrasonography of the right upper quadrant of the abdomen was performed. COMPARISON: None. CLINICAL HISTORY: Pain. FINDINGS: LIVER: The liver demonstrates increased echogenicity. Intrahepatic biliary ductal dilatation is present. No discrete lesions are visualized. BILIARY SYSTEM: Gallbladder wall thickness is within normal limits at 1.4 mm. No evidence of pericholecystic fluid. No cholelithiasis. Negative sonographic Murphy's sign. Common bile duct is mildly distended measuring 8.2 mm. RIGHT KIDNEY: The right kidney is grossly unremarkable in appearances without  evidence of hydronephrosis, echogenic calculi or worrisome mass lesions. PANCREAS: Visualized portions of the pancreas are unremarkable. OTHER: No right upper quadrant ascites. IMPRESSION: 1. Mildly distended common bile duct measuring 8.2 mm with intrahepatic biliary dilation. his suggests a distal obstruction. No obstructing lesion is visualized. 2. Hepatic steatosis. Electronically signed by: Lonni Necessary MD 11/11/2023 06:27 PM EDT RP Workstation: [id number]      Assessment &Plan  53 year old female with epigastric pain, nausea and vomiting in the setting of cholelithiasis and choledocholithiasis with dilated CBD  Patient will need cholecystectomy and ERCP There is no ERCP availability this weekend and tentatively procedure will be Tuesday Consult surgery for possible cholecystectomy during this admission  Advance diet to soft low fiber Monitor LFT's Will make patient n.p.o. after midnight in case if there is available to you to do ERCP tomorrow possible  History of vertebral artery dissection, family history of cerebral aneurysm, complains of unilateral recent onset headache worse with minimal strain, will order MRI brain to exclude any intracranial abnormality   This visit required >35 minutes of patient care (this includes precharting, chart review, review of results, face-to-face time used for counseling as well as treatment plan and follow-up. The patient was provided an opportunity to ask questions and all were answered. The patient agreed with the plan and demonstrated an understanding of the instructions.   LOIS Wilkie Mcgee , MD [phone]  [facility]

## 2023-11-13 NOTE — Progress Notes (Addendum)
 PROGRESS NOTE    Ruth Cooper  FMW:[id number] DOB: 20-Jun-1970 DOA: 11/11/2023 PCP: Watt Harlene BROCKS, MD   Brief Narrative: Ruth Cooper is a 53 y.o. female with a history of hypertension, vertebral artery dissection, cholelithiasis.  Patient presented secondary to abdominal pain and found to have concern for choledocholithiasis. GI consulted.   Assessment and Plan:  Possible choledocholithiasis Patient with postprandial epigastric abdominal pain with associated elevated AST, ALT, alkaline phosphatase and total bilirubin. RUQ ultrasound significant for hepatic steatosis and mildly distended common bile duct measuring 8.2 mm. GI consulted with plan for ERCP. Zosyn  started. MRCP obtained and is significant for multiple gallstones with associated cholelithiasis and gallbladder wall thickening. -GI recommendations: ERCP likely on 9/16 -Trend CMP -Continue Zosyn  -General surgery consultation  Primary hypertension Continue; increase to losartan  50 mg daily  GERD Continue Protonix   Left sided headache Patient describes these episodes as not a headache but rather sharp transient pain located in left temporal area that is associated with coughing, laughing, etc. No associated symptoms with these attacks, including no nausea, vomiting, vision changes, neurologic changes, lightheadedness. MRI ordered by GI to rule out aneurysm. -Follow-up MRI brain  Thrombocytopenia Mild.   DVT prophylaxis: SCDs Code Status:   Code Status: Full Code Family Communication: None at bedside Disposition Plan: Discharge pending ongoing GI recommendations   Consultants:  Gastroenterology  Procedures:  None  Antimicrobials: Zosyn  IV    Subjective: No issues this morning. No abdominal pain at rest. With abdominal pain with oral intake. No headaches.  Objective: BP (!) 152/91 (BP Location: Left Arm)   Pulse 81   Temp 98.1 F (36.7 C) (Oral)   Resp 17   Ht 5' 6 (1.676  m)   Wt 75.9 kg   SpO2 97%   BMI 27.01 kg/m   Examination:  General exam: Appears calm and comfortable Respiratory system: Clear to auscultation. Respiratory effort normal. Cardiovascular system: S1 & S2 heard, RRR. No murmurs, rubs, gallops or clicks. Gastrointestinal system: Abdomen is nondistended, soft and nontender. Normal bowel sounds heard. Central nervous system: Alert and oriented. No focal neurological deficits. Musculoskeletal: No edema. No calf tenderness Skin: No cyanosis. No rashes Psychiatry: Judgement and insight appear normal. Mood & affect appropriate.    Data Reviewed: I have personally reviewed following labs and imaging studies  CBC Lab Results  Component Value Date   WBC 5.0 11/13/2023   RBC 4.68 11/13/2023   HGB 14.6 11/13/2023   HCT 42.8 11/13/2023   MCV 91.5 11/13/2023   MCH 31.2 11/13/2023   PLT 145 (L) 11/13/2023   MCHC 34.1 11/13/2023   RDW 14.1 11/13/2023   LYMPHSABS 0.6 (L) 11/13/2023   MONOABS 0.6 11/13/2023   EOSABS 0.3 11/13/2023   BASOSABS 0.1 11/13/2023     Last metabolic panel Lab Results  Component Value Date   NA 138 11/13/2023   K 3.5 11/13/2023   CL 107 11/13/2023   CO2 22 11/13/2023   BUN 5 (L) 11/13/2023   CREATININE 0.79 11/13/2023   GLUCOSE 94 11/13/2023   GFRNONAA >60 11/13/2023   CALCIUM 8.9 11/13/2023   PROT 6.7 11/13/2023   ALBUMIN 3.6 11/13/2023   BILITOT 3.0 (H) 11/13/2023   ALKPHOS 217 (H) 11/13/2023   AST 150 (H) 11/13/2023   ALT 264 (H) 11/13/2023   ANIONGAP 9 11/13/2023    GFR: Estimated Creatinine Clearance: 84.6 mL/min (by C-G formula based on SCr of 0.79 mg/dL).  No results found for this or any previous visit (from  the past 240 hours).    Radiology Studies: MR ABDOMEN MRCP W WO CONTAST Result Date: 11/13/2023 EXAM: MRCP WITH AND WITHOUT IV CONTRAST 11/12/2023 03:11:17 PM TECHNIQUE: Multisequence, multiplanar magnetic resonance images of the abdomen with and without intravenous contrast. MRCP  sequences were performed. COMPARISON: Ultrasound 11/11/2023. CLINICAL HISTORY: Biliary obstruction suspected (Ped 0-17y); Probable common bile duct stones. FINDINGS: LIVER: No enhancing liver lesions identified. Diffuse periportal edema. GALLBLADDER AND BILIARY SYSTEM: Multiple gallstones are identified measuring around 6 mm, image 34/4. There is diffuse gallbladder wall enhancement and trace pericholecystic fluid. No significant gallbladder wall thickening. Dilatation of the cystic duct and common bile duct is noted with moderate intrahepatic bile duct dilatation. The common bile duct measures up to 1.2 cm. Numerous stones are identified throughout the common bile duct all measuring up to 8 mm. SPLEEN: Unremarkable. PANCREAS/PANCREATIC DUCT: Normal appearance of the pancreas without signs of main duct dilatation, inflammation or mass. ADRENAL GLANDS: Unremarkable. KIDNEYS: Unremarkable. LYMPH NODES: No enlarged abdominal lymph nodes. VASULATURE: Unremarkable. PERITONEUM: No ascites. ABDOMINAL WALL: No hernia. No mass. BOWEL: Grossly unremarkable. No bowel obstruction. BONES: No enhancing bone lesions. SOFT TISSUES: Unremarkable. MISCELLANEOUS: Unremarkable. IMPRESSION: 1. Multiple gallstones and numerous stones throughout the common bile duct, with associated dilatation of the cystic duct and common bile duct (up to 1.2 cm) and moderate intrahepatic bile duct dilatation. 2. Diffuse gallbladder wall enhancement and trace pericholecystic fluid without significant gallbladder wall thickening. 3. Diffuse periportal edema. Electronically signed by: Waddell Calk MD 11/13/2023 05:12 AM EDT RP Workstation: [id number]   MR 3D Recon At Scanner Result Date: 11/13/2023 EXAM: MRCP WITH AND WITHOUT IV CONTRAST 11/12/2023 03:11:17 PM TECHNIQUE: Multisequence, multiplanar magnetic resonance images of the abdomen with and without intravenous contrast. MRCP sequences were performed. COMPARISON: Ultrasound 11/11/2023. CLINICAL  HISTORY: Biliary obstruction suspected (Ped 0-17y); Probable common bile duct stones. FINDINGS: LIVER: No enhancing liver lesions identified. Diffuse periportal edema. GALLBLADDER AND BILIARY SYSTEM: Multiple gallstones are identified measuring around 6 mm, image 34/4. There is diffuse gallbladder wall enhancement and trace pericholecystic fluid. No significant gallbladder wall thickening. Dilatation of the cystic duct and common bile duct is noted with moderate intrahepatic bile duct dilatation. The common bile duct measures up to 1.2 cm. Numerous stones are identified throughout the common bile duct all measuring up to 8 mm. SPLEEN: Unremarkable. PANCREAS/PANCREATIC DUCT: Normal appearance of the pancreas without signs of main duct dilatation, inflammation or mass. ADRENAL GLANDS: Unremarkable. KIDNEYS: Unremarkable. LYMPH NODES: No enlarged abdominal lymph nodes. VASULATURE: Unremarkable. PERITONEUM: No ascites. ABDOMINAL WALL: No hernia. No mass. BOWEL: Grossly unremarkable. No bowel obstruction. BONES: No enhancing bone lesions. SOFT TISSUES: Unremarkable. MISCELLANEOUS: Unremarkable. IMPRESSION: 1. Multiple gallstones and numerous stones throughout the common bile duct, with associated dilatation of the cystic duct and common bile duct (up to 1.2 cm) and moderate intrahepatic bile duct dilatation. 2. Diffuse gallbladder wall enhancement and trace pericholecystic fluid without significant gallbladder wall thickening. 3. Diffuse periportal edema. Electronically signed by: Waddell Calk MD 11/13/2023 05:12 AM EDT RP Workstation: [id number]   US  Abdomen Limited RUQ (LIVER/GB) Result Date: 11/11/2023 EXAM: Right Upper Quadrant Abdominal Ultrasound TECHNIQUE: Real-time ultrasonography of the right upper quadrant of the abdomen was performed. COMPARISON: None. CLINICAL HISTORY: Pain. FINDINGS: LIVER: The liver demonstrates increased echogenicity. Intrahepatic biliary ductal dilatation is present. No discrete  lesions are visualized. BILIARY SYSTEM: Gallbladder wall thickness is within normal limits at 1.4 mm. No evidence of pericholecystic fluid. No cholelithiasis. Negative sonographic Murphy's sign. Common bile duct is mildly distended  measuring 8.2 mm. RIGHT KIDNEY: The right kidney is grossly unremarkable in appearances without evidence of hydronephrosis, echogenic calculi or worrisome mass lesions. PANCREAS: Visualized portions of the pancreas are unremarkable. OTHER: No right upper quadrant ascites. IMPRESSION: 1. Mildly distended common bile duct measuring 8.2 mm with intrahepatic biliary dilation. his suggests a distal obstruction. No obstructing lesion is visualized. 2. Hepatic steatosis. Electronically signed by: Lonni Necessary MD 11/11/2023 06:27 PM EDT RP Workstation: [id number]      LOS: 2 days    Elgin Lam, MD [facility] 11/13/2023, 11:38 AM   If 7PM-7AM, please contact night-coverage [email or web address]

## 2023-11-14 ENCOUNTER — Inpatient Hospital Stay (HOSPITAL_COMMUNITY): Payer: Self-pay

## 2023-11-14 ENCOUNTER — Other Ambulatory Visit: Payer: Self-pay

## 2023-11-14 ENCOUNTER — Inpatient Hospital Stay (HOSPITAL_COMMUNITY)

## 2023-11-14 ENCOUNTER — Encounter (HOSPITAL_COMMUNITY)
Admission: EM | Disposition: A | Discharge status: Home / Self Care | Payer: Self-pay | Source: Home / Self Care | Attending: Family Medicine

## 2023-11-14 ENCOUNTER — Encounter (HOSPITAL_COMMUNITY): Payer: Self-pay | Admitting: Internal Medicine

## 2023-11-14 DIAGNOSIS — G4452 New daily persistent headache (NDPH): Secondary | ICD-10-CM

## 2023-11-14 DIAGNOSIS — R061 Stridor: Secondary | ICD-10-CM

## 2023-11-14 DIAGNOSIS — I959 Hypotension, unspecified: Secondary | ICD-10-CM

## 2023-11-14 DIAGNOSIS — K8070 Calculus of gallbladder and bile duct without cholecystitis without obstruction: Secondary | ICD-10-CM

## 2023-11-14 DIAGNOSIS — A419 Sepsis, unspecified organism: Secondary | ICD-10-CM

## 2023-11-14 HISTORY — PX: CHOLECYSTECTOMY: SHX55

## 2023-11-14 LAB — CBC
HCT: 41.3 % (ref 36.0–46.0)
HCT: 35.3 % — ABNORMAL LOW (ref 36.0–46.0)
Hemoglobin: 14 g/dL (ref 12.0–15.0)
Hemoglobin: 11.8 g/dL — ABNORMAL LOW (ref 12.0–15.0)
MCH: 31.3 pg (ref 26.0–34.0)
MCH: 31.1 pg (ref 26.0–34.0)
MCHC: 33.9 g/dL (ref 30.0–36.0)
MCHC: 33.4 g/dL (ref 30.0–36.0)
MCV: 92.2 fL (ref 80.0–100.0)
MCV: 92.9 fL (ref 80.0–100.0)
Platelets: 151 K/uL (ref 150–400)
Platelets: 198 K/uL (ref 150–400)
RBC: 4.48 MIL/uL (ref 3.87–5.11)
RBC: 3.8 MIL/uL — ABNORMAL LOW (ref 3.87–5.11)
RDW: 13.6 % (ref 11.5–15.5)
RDW: 13.9 % (ref 11.5–15.5)
WBC: 6.5 K/uL (ref 4.0–10.5)
WBC: 11.2 K/uL — ABNORMAL HIGH (ref 4.0–10.5)
nRBC: 0 % (ref 0.0–0.2)
nRBC: 0 % (ref 0.0–0.2)

## 2023-11-14 LAB — COMPREHENSIVE METABOLIC PANEL WITH GFR
ALT: 200 U/L — ABNORMAL HIGH (ref 0–44)
AST: 96 U/L — ABNORMAL HIGH (ref 15–41)
Albumin: 3.4 g/dL — ABNORMAL LOW (ref 3.5–5.0)
Alkaline Phosphatase: 205 U/L — ABNORMAL HIGH (ref 38–126)
Anion gap: 11 (ref 5–15)
BUN: 10 mg/dL (ref 6–20)
CO2: 23 mmol/L (ref 22–32)
Calcium: 8.8 mg/dL — ABNORMAL LOW (ref 8.9–10.3)
Chloride: 104 mmol/L (ref 98–111)
Creatinine, Ser: 0.87 mg/dL (ref 0.44–1.00)
GFR, Estimated: >60 mL/min (ref >60)
Glucose, Bld: 116 mg/dL — ABNORMAL HIGH (ref 70–99)
Potassium: 3.3 mmol/L — ABNORMAL LOW (ref 3.5–5.1)
Sodium: 138 mmol/L (ref 135–145)
Total Bilirubin: 1.8 mg/dL — ABNORMAL HIGH (ref 0.0–1.2)
Total Protein: 6.2 g/dL — ABNORMAL LOW (ref 6.5–8.1)

## 2023-11-14 LAB — TYPE AND SCREEN
ABO/RH(D): A POS
Antibody Screen: NEGATIVE

## 2023-11-14 LAB — ABO/RH: ABO/RH(D): A POS

## 2023-11-14 LAB — LACTIC ACID, PLASMA: Lactic Acid, Venous: 1.9 mmol/L (ref 0.5–1.9)

## 2023-11-14 LAB — PREGNANCY, URINE: Preg Test, Ur: NEGATIVE

## 2023-11-14 LAB — MRSA NEXT GEN BY PCR, NASAL: MRSA by PCR Next Gen: NOT DETECTED

## 2023-11-14 SURGERY — LAPAROSCOPIC CHOLECYSTECTOMY
Anesthesia: General

## 2023-11-14 MED ORDER — CHLORHEXIDINE GLUCONATE 0.12 % MT SOLN: 15.0000 mL | Freq: Once | OROMUCOSAL | Status: AC

## 2023-11-14 MED ORDER — SODIUM CHLORIDE 0.9 % IV BOLUS
1000.0000 mL | Freq: Once | INTRAVENOUS | Status: AC
  Administered 2023-11-14: 1000 mL via INTRAVENOUS

## 2023-11-14 MED ORDER — HYDROMORPHONE HCL 1 MG/ML IJ SOLN
INTRAMUSCULAR | Status: AC
  Filled 2023-11-14: qty 0.5

## 2023-11-14 MED ORDER — ROCURONIUM BROMIDE 10 MG/ML (PF) SYRINGE
PREFILLED_SYRINGE | INTRAVENOUS | Status: DC | PRN
  Administered 2023-11-14: 60 mg via INTRAVENOUS
  Administered 2023-11-14: 10 mg via INTRAVENOUS

## 2023-11-14 MED ORDER — MIDAZOLAM HCL 2 MG/2ML IJ SOLN
INTRAMUSCULAR | Status: AC
  Filled 2023-11-14: qty 2

## 2023-11-14 MED ORDER — FENTANYL CITRATE (PF) 100 MCG/2ML IJ SOLN: 25.0000 ug | INTRAMUSCULAR | Status: DC | PRN

## 2023-11-14 MED ORDER — ONDANSETRON HCL 4 MG/2ML IJ SOLN
INTRAMUSCULAR | Status: AC
  Filled 2023-11-14: qty 2

## 2023-11-14 MED ORDER — CHLORHEXIDINE GLUCONATE 0.12 % MT SOLN
OROMUCOSAL | Status: AC
  Administered 2023-11-14: 15 mL via OROMUCOSAL
  Filled 2023-11-14: qty 15

## 2023-11-14 MED ORDER — BUPIVACAINE-EPINEPHRINE (PF) 0.25% -1:200000 IJ SOLN
INTRAMUSCULAR | Status: DC | PRN
  Administered 2023-11-14: 30 mL

## 2023-11-14 MED ORDER — DROPERIDOL 2.5 MG/ML IJ SOLN: 0.6250 mg | Freq: Once | INTRAMUSCULAR | Status: DC | PRN

## 2023-11-14 MED ORDER — LACTATED RINGERS IV SOLN
INTRAVENOUS | Status: DC
Start: 2023-11-14 — End: 2023-11-14

## 2023-11-14 MED ORDER — HYDROMORPHONE HCL 1 MG/ML IJ SOLN
0.5000 mg | INTRAMUSCULAR | Status: DC | PRN
  Administered 2023-11-14 – 2023-11-15 (×4): 1 mg via INTRAVENOUS
  Administered 2023-11-15: 0.5 mg via INTRAVENOUS
  Filled 2023-11-14 (×5): qty 1

## 2023-11-14 MED ORDER — CHLORHEXIDINE GLUCONATE CLOTH 2 % EX PADS
6.0000 | MEDICATED_PAD | Freq: Every day | CUTANEOUS | Status: DC
  Administered 2023-11-14 – 2023-11-16 (×3): 6 via TOPICAL

## 2023-11-14 MED ORDER — FENTANYL CITRATE (PF) 250 MCG/5ML IJ SOLN
INTRAMUSCULAR | Status: AC
  Filled 2023-11-14: qty 5

## 2023-11-14 MED ORDER — OXYCODONE HCL 5 MG PO TABS
5.0000 mg | ORAL_TABLET | Freq: Once | ORAL | Status: AC | PRN
  Administered 2023-11-14: 5 mg via ORAL

## 2023-11-14 MED ORDER — SCOPOLAMINE 1 MG/3DAYS TD PT72
1.0000 | MEDICATED_PATCH | TRANSDERMAL | Status: DC
  Administered 2023-11-14: 1 mg via TRANSDERMAL
  Filled 2023-11-14: qty 1

## 2023-11-14 MED ORDER — ACETAMINOPHEN 10 MG/ML IV SOLN
INTRAVENOUS | Status: DC | PRN
Start: 2023-11-14 — End: 2023-11-14
  Administered 2023-11-14: 1000 mg via INTRAVENOUS

## 2023-11-14 MED ORDER — PROPOFOL 10 MG/ML IV BOLUS
INTRAVENOUS | Status: DC | PRN
  Administered 2023-11-14: 20 mg via INTRAVENOUS
  Administered 2023-11-14: 110 mg via INTRAVENOUS
  Administered 2023-11-14: 30 mg via INTRAVENOUS
  Administered 2023-11-14: 40 mg via INTRAVENOUS

## 2023-11-14 MED ORDER — ONDANSETRON HCL 4 MG/2ML IJ SOLN: 4.0000 mg | Freq: Once | INTRAMUSCULAR | Status: DC | PRN

## 2023-11-14 MED ORDER — ONDANSETRON HCL 4 MG/2ML IJ SOLN
INTRAMUSCULAR | Status: DC | PRN
Start: 2023-11-14 — End: 2023-11-14
  Administered 2023-11-14: 4 mg via INTRAVENOUS

## 2023-11-14 MED ORDER — MIDAZOLAM HCL 2 MG/2ML IJ SOLN
INTRAMUSCULAR | Status: DC | PRN
Start: 2023-11-14 — End: 2023-11-14
  Administered 2023-11-14: 2 mg via INTRAVENOUS

## 2023-11-14 MED ORDER — LIDOCAINE 2% (20 MG/ML) 5 ML SYRINGE
INTRAMUSCULAR | Status: DC | PRN
  Administered 2023-11-14: 100 mg via INTRAVENOUS

## 2023-11-14 MED ORDER — OXYCODONE HCL 5 MG/5ML PO SOLN: 5.0000 mg | Freq: Once | ORAL | Status: AC | PRN

## 2023-11-14 MED ORDER — FENTANYL CITRATE (PF) 250 MCG/5ML IJ SOLN
INTRAMUSCULAR | Status: DC | PRN
  Administered 2023-11-14: 100 ug via INTRAVENOUS
  Administered 2023-11-14 (×3): 50 ug via INTRAVENOUS

## 2023-11-14 MED ORDER — DEXAMETHASONE SODIUM PHOSPHATE 10 MG/ML IJ SOLN
4.0000 mg | Freq: Two times a day (BID) | INTRAMUSCULAR | Status: DC
  Administered 2023-11-14 – 2023-11-15 (×3): 4 mg via INTRAVENOUS
  Filled 2023-11-14 (×3): qty 1

## 2023-11-14 MED ORDER — FAMOTIDINE IN NACL 20-0.9 MG/50ML-% IV SOLN
20.0000 mg | Freq: Two times a day (BID) | INTRAVENOUS | Status: DC
Start: 2023-11-14 — End: 2023-11-14
  Filled 2023-11-14: qty 50

## 2023-11-14 MED ORDER — SUGAMMADEX SODIUM 200 MG/2ML IV SOLN
INTRAVENOUS | Status: DC | PRN
  Administered 2023-11-14: 100 mg via INTRAVENOUS
  Administered 2023-11-14 (×2): 50 mg via INTRAVENOUS

## 2023-11-14 MED ORDER — OXYCODONE HCL 5 MG PO TABS
ORAL_TABLET | ORAL | Status: AC
  Filled 2023-11-14: qty 1

## 2023-11-14 MED ORDER — POTASSIUM CHLORIDE CRYS ER 20 MEQ PO TBCR
40.0000 meq | EXTENDED_RELEASE_TABLET | Freq: Once | ORAL | Status: DC
  Filled 2023-11-14: qty 2

## 2023-11-14 MED ORDER — PANTOPRAZOLE SODIUM 40 MG IV SOLR
40.0000 mg | Freq: Every day | INTRAVENOUS | Status: DC
Start: 2023-11-14 — End: 2023-11-14

## 2023-11-14 MED ORDER — DEXAMETHASONE SODIUM PHOSPHATE 10 MG/ML IJ SOLN
INTRAMUSCULAR | Status: AC
  Filled 2023-11-14: qty 1

## 2023-11-14 MED ORDER — ROCURONIUM BROMIDE 10 MG/ML (PF) SYRINGE
PREFILLED_SYRINGE | INTRAVENOUS | Status: AC
  Filled 2023-11-14: qty 10

## 2023-11-14 MED ORDER — DEXAMETHASONE SODIUM PHOSPHATE 10 MG/ML IJ SOLN
INTRAMUSCULAR | Status: DC | PRN
  Administered 2023-11-14: 10 mg via INTRAVENOUS

## 2023-11-14 MED ORDER — PROPOFOL 10 MG/ML IV BOLUS
INTRAVENOUS | Status: AC
  Filled 2023-11-14: qty 20

## 2023-11-14 MED ORDER — LIDOCAINE 2% (20 MG/ML) 5 ML SYRINGE
INTRAMUSCULAR | Status: AC
  Filled 2023-11-14: qty 5

## 2023-11-14 MED ORDER — ORAL CARE MOUTH RINSE: 15.0000 mL | Freq: Once | OROMUCOSAL | Status: AC

## 2023-11-14 MED ORDER — HYDROMORPHONE HCL 1 MG/ML IJ SOLN
INTRAMUSCULAR | Status: DC | PRN
  Administered 2023-11-14: .5 mg via INTRAVENOUS

## 2023-11-14 SURGICAL SUPPLY — 35 items
BAG COUNTER SPONGE SURGICOUNT (BAG) ×2 IMPLANT
CANISTER SUCTION 3000ML PPV (SUCTIONS) ×2 IMPLANT
CHLORAPREP W/TINT 26 (MISCELLANEOUS) ×2 IMPLANT
CLIP APPLIE ROT 10 11.4 M/L (STAPLE) ×2 IMPLANT
COVER SURGICAL LIGHT HANDLE (MISCELLANEOUS) ×2 IMPLANT
DERMABOND ADVANCED .7 DNX12 (GAUZE/BANDAGES/DRESSINGS) ×2 IMPLANT
ELECTRODE REM PT RTRN 9FT ADLT (ELECTROSURGICAL) ×2 IMPLANT
ENDOLOOP SUT PDS II 0 18 (SUTURE) IMPLANT
GLOVE BIO SURGEON STRL SZ7.5 (GLOVE) ×2 IMPLANT
GLOVE BIOGEL PI IND STRL 8 (GLOVE) ×2 IMPLANT
GOWN STRL REUS W/ TWL LRG LVL3 (GOWN DISPOSABLE) ×4 IMPLANT
GOWN STRL REUS W/ TWL XL LVL3 (GOWN DISPOSABLE) ×2 IMPLANT
GRASPER SUT TROCAR 14GX15 (MISCELLANEOUS) ×2 IMPLANT
IRRIGATION SUCT STRKRFLW 2 WTP (MISCELLANEOUS) ×2 IMPLANT
KIT BASIN OR (CUSTOM PROCEDURE TRAY) ×2 IMPLANT
KIT IMAGING PINPOINTPAQ (MISCELLANEOUS) IMPLANT
KIT TURNOVER KIT B (KITS) ×2 IMPLANT
NDL 22X1.5 STRL (OR ONLY) (MISCELLANEOUS) ×2 IMPLANT
NDL INSUFFLATION 14GA 120MM (NEEDLE) ×2 IMPLANT
NEEDLE 22X1.5 STRL (OR ONLY) (MISCELLANEOUS) ×1 IMPLANT
NEEDLE INSUFFLATION 14GA 120MM (NEEDLE) ×1 IMPLANT
NS IRRIG 1000ML POUR BTL (IV SOLUTION) ×2 IMPLANT
PAD ARMBOARD POSITIONER FOAM (MISCELLANEOUS) ×2 IMPLANT
POUCH RETRIEVAL ECOSAC 10 (ENDOMECHANICALS) ×2 IMPLANT
SCISSORS LAP 5X35 DISP (ENDOMECHANICALS) ×2 IMPLANT
SET TUBE SMOKE EVAC HIGH FLOW (TUBING) ×2 IMPLANT
SLEEVE Z-THREAD 5X100MM (TROCAR) ×4 IMPLANT
SUT MNCRL AB 4-0 PS2 18 (SUTURE) ×2 IMPLANT
TOWEL GREEN STERILE (TOWEL DISPOSABLE) ×2 IMPLANT
TOWEL GREEN STERILE FF (TOWEL DISPOSABLE) ×2 IMPLANT
TRAY LAPAROSCOPIC MC (CUSTOM PROCEDURE TRAY) ×2 IMPLANT
TROCAR 11X100 Z THREAD (TROCAR) ×2 IMPLANT
TROCAR Z-THREAD OPTICAL 5X100M (TROCAR) ×2 IMPLANT
WARMER LAPAROSCOPE (MISCELLANEOUS) ×2 IMPLANT
WATER STERILE IRR 1000ML POUR (IV SOLUTION) ×2 IMPLANT

## 2023-11-14 NOTE — Transfer of Care (Signed)
 Immediate Anesthesia Transfer of Care Note  Patient: Ruth Cooper  Procedure(s) Performed: LAPAROSCOPIC CHOLECYSTECTOMY  Patient Location: PACU  Anesthesia Type:General  Level of Consciousness: drowsy  Airway & Oxygen Therapy: Patient Spontanous Breathing and Patient connected to face mask oxygen  Post-op Assessment: Report given to RN and Post -op Vital signs reviewed and stable  Post vital signs: Reviewed and stable  Last Vitals:  Vitals Value Taken Time  BP 150/76   Temp 97.8   Pulse 79   Resp 19   SpO2 100     Last Pain:  Vitals:   11/14/23 1124  TempSrc:   PainSc: 0-No pain         Complications: No notable events documented.

## 2023-11-14 NOTE — Plan of Care (Signed)

## 2023-11-14 NOTE — Discharge Instructions (Signed)

## 2023-11-14 NOTE — Progress Notes (Signed)
 Contacted by patient's nurse regarding hypotension, BP 88/60, after administering dilaudid  IV. Patient is post-operative from laparoscopic cholecystectomy. Upon blood pressure recheck, informed patient's blood pressure lower at 66/45 with MAP of 56 and development of shortness of breath and development of inspiratory stridor. Upon arriving to bedside, met with patient, patient's mother and patient's father. Patient reports pain is better controlled after dilaudid , but she is having trouble with taking in breaths, although sometimes she is able to take in a good breath. No overt dyspnea. On examination, patient is afebrile, patient appears to be in slight distress with increased inspiratory effort at times. Normal sinus rhythm. Respiratory exam significant for intermittent inspiratory stridor and possibly rales. Radial pulses 1+ bilaterally. Skin is warm and flush.  Assessment/plan:  Stridor Respiratory distress Post-operatively after anesthesia/intubation. 2 L/min of oxygen applied. -Check x-ray -PCCM consultation  Hypotension Possibly related to IV narcotics. No fever. Patient is still on Zosyn  IV. EBL documented at 15 mL. -1 L NS bolus -q15 minute vitals -Check stat CBC and lactic acid -Will get a type and screen  Disposition: Transfer to ICU  CRITICAL CARE Performed by: Elgin Lam, MD   Total critical care time: 72 minutes  Critical care time was exclusive of separately billable procedures and treating other patients.  Critical care was necessary to treat or prevent imminent or life-threatening deterioration.  Critical care was time spent personally by me on the following activities: development of treatment plan with patient and/or surrogate as well as nursing, discussions with consultants, evaluation of patient's response to treatment, examination of patient, obtaining history from patient or surrogate, ordering and performing treatments and interventions, ordering and review of  laboratory studies, ordering and review of radiographic studies, pulse oximetry and re-evaluation of patient's condition.

## 2023-11-14 NOTE — Anesthesia Postprocedure Evaluation (Signed)
 Anesthesia Post Note  Patient: Ruth Cooper  Procedure(s) Performed: LAPAROSCOPIC CHOLECYSTECTOMY     Patient location during evaluation: PACU Anesthesia Type: General Level of consciousness: awake and alert Pain management: pain level controlled Vital Signs Assessment: post-procedure vital signs reviewed and stable Respiratory status: spontaneous breathing, nonlabored ventilation and respiratory function stable Cardiovascular status: blood pressure returned to baseline and stable Postop Assessment: no apparent nausea or vomiting Anesthetic complications: no   No notable events documented.  Last Vitals:  Vitals:   11/14/23 1445 11/14/23 1500  BP: (!) 147/72 (!) 154/91  Pulse: 66 69  Resp: 17 (!) 21  Temp:  36.6 C  SpO2: 98% 97%    Last Pain:  Vitals:   11/14/23 1500  TempSrc:   PainSc: 4                  Lauraine KATHEE [place]

## 2023-11-14 NOTE — Op Note (Signed)
 Patient: Ruth Cooper (March 02, 1970, [id number])  Date of Surgery: 11/14/2023  Preoperative Diagnosis: Cholecystitis   Postoperative Diagnosis: Cholecystitis   Surgical Procedure: LAPAROSCOPIC CHOLECYSTECTOMY:     Operative Team Members:  Surgeons and Role:    * Ursala Cressy, Deward PARAS, MD - Primary   Anesthesiologist: Waddell Lauraine NOVAK, MD CRNA: Atanacio Arland HERO, CRNA; Emmitt Millman, CRNA   Anesthesia: General   Fluids:  Total I/O In: 1100 [I.V.:1000; IV Piggyback:100] Out: 165 [Urine:150; Blood:15]  Complications: * No complications entered in OR log *  Drains:  none   Specimen:  ID Type Source Tests Collected by Time Destination  1 : GALLBLADDER Tissue PATH Gallbladder SURGICAL PATHOLOGY Dorsey Authement, Deward PARAS, MD 11/14/2023 1138      Disposition:  PACU - hemodynamically stable.  Plan of Care: Continue inpatient care    Indications for Procedure: Ruth Cooper is a 53 y.o. female who presented with choledocholithiasis.  ERCP was not available immediately so I recommended proceeding with laparoscopic cholecystectomy.  The procedure itself, as well as the risks, benefits and alternatives were discussed with the patient.  Risks discussed included but were not limited to the risk of infection, bleeding, damage to nearby structures, need to convert to open procedure, incisional hernia, bile leak, common bile duct injury and the need for additional procedures or surgeries.  With this discussion complete and all questions answered the patient granted consent to proceed.  Findings: Massive gallbladder with many gallstones, one large one lodged in the neck of the gallbladder.  Infection status: Patient: Ruth Cooper [facility] Patient Case: Urgent Infection Present At Time Of Surgery (PATOS): Gallstones   Description of Procedure:   On the date stated above, the patient was taken to the operating room suite and placed in supine  positioning.  Sequential compression devices were placed on the lower extremities to prevent blood clots.  General endotracheal anesthesia was induced. Preoperative antibiotics were given.  The patient's abdomen was prepped and draped in the usual sterile fashion.  A time-out was completed verifying the correct patient, procedure, positioning and equipment needed for the case.  We began by anesthetizing the skin with local anesthetic and then making a 5 mm incision just below the umbilicus.  We dissected through the subcutaneous tissues to the fascia.  The fascia was grasped and elevated using a Kocher clamp.  A Veress needle was inserted into the abdomen and the abdomen was insufflated to 15 mmHg.  A 5 mm trocar was inserted in this position under optical guidance and then the abdomen was inspected.  There was no trauma to the underlying viscera with initial trocar placement.  Any abnormal findings, other than inflammation in the right upper quadrant, are listed above in the findings section.  Three additional trocars were placed, one 12 mm trocar in the subxiphoid position, one 5 mm trocar in the midline epigastric area and one 5mm trocar in the right upper quadrant subcostally.  These were placed under direct vision without any trauma to the underlying viscera.    The patient was then placed in head up, left side down positioning.  The gallbladder was identified and dissected free from its attachments to the omentum allowing the duodenum to fall away.  The gallbladder was massive, so to achieve adquate retraction, I performed a dome down dissection.  The gallbladder was first dissected off the cystic plate.  The cystic artery and duct were identifed as the only structures entering the gallbladder.  The artery was  clipped and divided.  The cystic duct then was ligated using three 0 PDS endoloops - two on the cystic duct stump and one on the infundibulum.  I then divided the cystic duct and removed the  gallbladder through the subxiphoid port site.  The clips and endoloop were inspected and appeared effective.  The cystic plate was inspected and hemostasis was obtained using electrocautery.  A suction irrigator was used to clean the operative field.  Attention was turned to closure.  The 12 mm subxiphoid port site was closed using a 0-vicryl suture on a fascial suture passer.  The abdomen was desufflated.  The skin was closed using 4-0 monocryl and dermabond.  All sponge and needle counts were correct at the conclusion of the case.    Deward Foy, MD General, Bariatric, & Minimally Invasive Surgery [facility], [place]

## 2023-11-14 NOTE — Consult Note (Signed)
 NAME:  Ruth Cooper, MRN:  [id number], DOB:  April 14, 1970, LOS: 3 ADMISSION DATE:  11/11/2023, CONSULTATION DATE:  11/14/23 REFERRING MD:  Briana CHIEF COMPLAINT:  Abd pain   History of Present Illness:  Ruth Cooper is a 53 y.o. female who has a PMH as outlined below.  She presented to [facility] ED on 9/13 with abdominal pain.  Symptoms had been going on for 10 days or worse postprandial.  In ED, she had right upper quadrant ultrasound that demonstrated hepatic steatosis, mildly distended CBD with intrahepatic biliary dilation suggesting distal obstruction.  She was evaluated by GI who performed MRCP that confirmed multiple gallstones and stones throughout the CBD with dilatation of the cystic duct and CBD.  Laparoscopic cholecystectomy was recommended.  On 9/15, she was taken to the OR by general surgery and had laparoscopic cholecystectomy.  Later that evening, she had hypotension and stridor.  She received 1 L of saline without improvement in BP and also had ongoing stridor.  PCCM subsequently consulted for assistance.  She is to be transferred to the ICU for ongoing monitoring.  Pertinent  Medical History:  has Knee pain, right; Migraine without aura and without status migrainosus, not intractable; Memory loss; Benign microscopic hematuria; HTN (hypertension); Frequent headaches; Ascending aorta dilatation (HCC); COVID-19 virus infection; Choledocholithiasis; and GERD (gastroesophageal reflux disease) on their problem list.   Significant Hospital Events: Including procedures, antibiotic start and stop dates in addition to other pertinent events   9/13 admit, GI consult and MRCP confirmed multiple gallstones and stones throughout CBD with dilatation of the cystic duct and CBD. 9/15 lap chole, hypotensive after, PCCM consult    Interim History / Subjective:    Objective:  Blood pressure (!) 88/60, pulse 82, temperature 97.7 F (36.5 C), temperature source Oral,  resp. rate 16, height 5' 6 (1.676 m), weight 74.8 kg, SpO2 97%.        Intake/Output Summary (Last 24 hours) at 11/14/2023 1747 Last data filed at 11/14/2023 1413 Gross per 24 hour  Intake 1100 ml  Output 165 ml  Net 935 ml   Filed Weights   11/11/23 1731 11/14/23 1059  Weight: 75.9 kg 74.8 kg     Physical Exam: General: well appearing, well nourished woman, on room air Neuro: slightly anxious, able to speak in whispered tone HEENT: mmm, no oropharyngeal asymmetry, no thrush Cardiovascular: RRR no mrg Lungs: inspiratory stridor which is variable, difficulty with inhalation, otherwise no wheezes or crackles Abdomen: soft, slightly tender to palpation Musculoskeletal: no edema Skin: no rashes  Labs/imaging personally reviewed:  MRCP 9/13 >  multiple gallstones and stones throughout CBD with dilatation of the cystic duct and CBD.  Chest xray 9/15 clear no acute cardiopulmonary process  Assessment & Plan:   Post-intubation stridor -likely secondary to endotracheal intubation earlier today for laparascopic cholecystectomy - she denise smoking, asthma, GERD - empiric decadron , H2 blocker, PPI - I do not find racemic epi to be helpful in these scenarios and sometimes the stridor worsens with vocal cord irritation.  - admit to ICU for observation overnight should she have decompensation she can be monitor for need for intubation  Severe sepsis 2/2 cholecystitis - POD 0 cholecystectomy - slight hypotension following procedure - continue zosyn  - may need additional fluid resuscitation   The patient is critically ill due to stridor.  Critical care was necessary to treat or prevent imminent or life-threatening deterioration.  Critical care was time spent personally by me on the following activities:  development of treatment plan with patient and/or surrogate as well as nursing, discussions with consultants, evaluation of patient's response to treatment, examination of patient,  obtaining history from patient or surrogate, ordering and performing treatments and interventions, ordering and review of laboratory studies, ordering and review of radiographic studies, pulse oximetry, re-evaluation of patient's condition and participation in multidisciplinary rounds.   Critical Care Time devoted to patient care services described in this note is 45 minutes. This time reflects time of care of this signee Armas Mcbee S Kennedey Digilio . This critical care time does not reflect separately billable procedures or procedure time, teaching time or supervisory time of PA/NP/Med student/Med Resident etc but could involve care discussion time.       Verdon GORMAN [facility] [employer] [facility] 11/14/2023 6:22 PM  Pager: see AMION  If no response to pager , please call critical care on call (see AMION) until 7pm After 7:00 pm call Elink     Labs   CBC: Recent Labs  Lab 11/11/23 1750 11/12/23 0414 11/13/23 0521 11/14/23 0313  WBC 10.7* 8.6 5.0 6.5  NEUTROABS 9.8*  --  3.6  --   HGB 14.6 14.2 14.6 14.0  HCT 43.5 41.2 42.8 41.3  MCV 91.0 90.5 91.5 92.2  PLT 163 147* 145* 151    Basic Metabolic Panel: Recent Labs  Lab 11/11/23 1750 11/12/23 0414 11/13/23 0521 11/14/23 0313  NA 140 137 138 138  K 3.7 3.2* 3.5 3.3*  CL 104 104 107 104  CO2 24 22 22 23   GLUCOSE 139* 114* 94 116*  BUN 12 7 5* 10  CREATININE 0.80 0.69 0.79 0.87  CALCIUM 9.0 8.5* 8.9 8.8*  MG  --  1.8  --   --    GFR: Estimated Creatinine Clearance: 77.3 mL/min (by C-G formula based on SCr of 0.87 mg/dL). Recent Labs  Lab 11/11/23 1750 11/12/23 0414 11/13/23 0521 11/14/23 0313  WBC 10.7* 8.6 5.0 6.5    Liver Function Tests: Recent Labs  Lab 11/11/23 1750 11/12/23 0414 11/13/23 0521 11/14/23 0313  AST 564* 294* 150* 96*  ALT 508* 352* 264* 200*  ALKPHOS 284* 205* 217* 205*  BILITOT 1.9* 4.1* 3.0* 1.8*  PROT 7.0 6.2* 6.7 6.2*  ALBUMIN 4.6 3.6 3.6 3.4*   Recent Labs  Lab  11/11/23 1750  LIPASE 29   No results for input(s): AMMONIA in the last 168 hours.  ABG No results found for: PHART, PCO2ART, PO2ART, HCO3, TCO2, ACIDBASEDEF, O2SAT   Coagulation Profile: Recent Labs  Lab 11/13/23 0521  INR 1.0    Cardiac Enzymes: No results for input(s): CKTOTAL, CKMB, CKMBINDEX, TROPONINI in the last 168 hours.  HbA1C: Hgb A1c MFr Bld  Date/Time Value Ref Range Status  09/22/2023 08:47 AM 5.5 4.6 - 6.5 % Final    Comment:    Glycemic Control Guidelines for People with Diabetes:Non Diabetic:  <6%Goal of Therapy: <7%Additional Action Suggested:  >8%   03/28/2023 02:37 PM 5.5 4.6 - 6.5 % Final    Comment:    Glycemic Control Guidelines for People with Diabetes:Non Diabetic:  <6%Goal of Therapy: <7%Additional Action Suggested:  >8%     CBG: No results for input(s): GLUCAP in the last 168 hours.  Review of Systems:   +shortness of breath with inhalation - cough, wheeze, fever  Past Medical History:  She,  has a past medical history of Frequent headaches and Vertebral artery dissection (HCC).   Surgical History:   Past Surgical History:  Procedure  Laterality Date   BACK SURGERY  12/08/2017   cervical disc    CERVICAL DISC ARTHROPLASTY  2020   ENDOMETRIAL ABLATION  2020   TONSILLECTOMY  1997   WISDOM TOOTH EXTRACTION       Social History:   reports that she has never smoked. She has never used smokeless tobacco. She reports that she does not drink alcohol and does not use drugs.   Family History:  Her family history includes Allergies in her son; Alzheimer's disease in her paternal grandmother; Breast cancer (age of onset: 73) in her mother; Breast cancer (age of onset: 24) in her maternal grandmother; Colon cancer in her maternal grandfather and mother; Healthy in her brother, daughter, father, and son; Heart disease in her paternal grandfather; Hypertension in her paternal grandfather. There is no history of Esophageal  cancer, Stomach cancer, or Rectal cancer.   Allergies Allergies  Allergen Reactions   Trazodone  Other (See Comments)    Excessive drowsiness Confusion Dizziness      Home Medications  Prior to Admission medications   Medication Sig Start Date End Date Taking? Authorizing Provider  aspirin EC 81 MG tablet Take 81 mg by mouth daily.   Yes [provider]  ibuprofen  (ADVIL ) 200 MG tablet Take 400 mg by mouth 2 (two) times daily as needed for headache or moderate pain (pain score 4-6).   Yes [provider]  losartan  (COZAAR ) 25 MG tablet TAKE 1 TABLET (25 MG TOTAL) BY MOUTH DAILY. 10/24/23  Yes Copland, Harlene BROCKS, MD

## 2023-11-14 NOTE — Progress Notes (Addendum)
 PROGRESS NOTE    Ruth Cooper  FMW:[id number] DOB: [birth year] DOA: 11/11/2023 PCP: Watt Harlene BROCKS, MD   Brief Narrative: Ruth Cooper is a 53 y.o. female with a history of hypertension, vertebral artery dissection, cholelithiasis.  Patient presented secondary to abdominal pain and found to have concern for choledocholithiasis. GI consulted. MRCP confirmed choledocholithiasis with gallbladder wall thickening. General surgery consulted for cholecystectomy.   Assessment and Plan:  Choledocholithiasis Patient with postprandial epigastric abdominal pain with associated elevated AST, ALT, alkaline phosphatase and total bilirubin. RUQ ultrasound significant for hepatic steatosis and mildly distended common bile duct measuring 8.2 mm. GI consulted with plan for ERCP. Zosyn  started. MRCP obtained and is significant for multiple gallstones with associated cholelithiasis and gallbladder wall thickening. -GI recommendations: ERCP on 9/16 -Trend CMP -Continue Zosyn  -General surgery recommendations: cholecystectomy 9/15  Primary hypertension Continue; increase to losartan  50 mg daily  GERD Continue Protonix   Left sided headache Patient describes these episodes as not a headache but rather sharp transient pain located in left temporal area that is associated with coughing, laughing, etc. No associated symptoms with these attacks, including no nausea, vomiting, vision changes, neurologic changes, lightheadedness. MRI ordered by GI to rule out aneurysm. MRI brain unremarkable.  Thrombocytopenia Mild. Resolved.  Hypokalemia -Potassium supplementation   DVT prophylaxis: SCDs Code Status:   Code Status: Full Code Family Communication: Mother and father at bedside Disposition Plan: Discharge pending ongoing GI recommendations   Consultants:  Gastroenterology  Procedures:  None  Antimicrobials: Zosyn  IV    Subjective: No concerns this morning. Feels well.  Patient received conflicting information regarding ERCP vs cholecystectomy timing. No other concerns.  Objective: BP (!) 158/94 (BP Location: Left Arm)   Pulse 70   Temp 98.1 F (36.7 C)   Resp 16   Ht 5' 6 (1.676 m)   Wt 75.9 kg   SpO2 100%   BMI 27.01 kg/m   Examination:  General exam: Appears calm and comfortable Respiratory system: Clear to auscultation. Respiratory effort normal. Cardiovascular system: S1 & S2 heard, RRR. No murmurs. Gastrointestinal system: Abdomen is nondistended, soft and nontender. Normal bowel sounds heard. Central nervous system: Alert and oriented. Psychiatry: Judgement and insight appear normal. Mood & affect appropriate.    Data Reviewed: I have personally reviewed following labs and imaging studies  CBC Lab Results  Component Value Date   WBC 6.5 11/14/2023   RBC 4.48 11/14/2023   HGB 14.0 11/14/2023   HCT 41.3 11/14/2023   MCV 92.2 11/14/2023   MCH 31.3 11/14/2023   PLT 151 11/14/2023   MCHC 33.9 11/14/2023   RDW 13.6 11/14/2023   LYMPHSABS 0.6 (L) 11/13/2023   MONOABS 0.6 11/13/2023   EOSABS 0.3 11/13/2023   BASOSABS 0.1 11/13/2023     Last metabolic panel Lab Results  Component Value Date   NA 138 11/14/2023   K 3.3 (L) 11/14/2023   CL 104 11/14/2023   CO2 23 11/14/2023   BUN 10 11/14/2023   CREATININE 0.87 11/14/2023   GLUCOSE 116 (H) 11/14/2023   GFRNONAA >60 11/14/2023   CALCIUM 8.8 (L) 11/14/2023   PROT 6.2 (L) 11/14/2023   ALBUMIN 3.4 (L) 11/14/2023   BILITOT 1.8 (H) 11/14/2023   ALKPHOS 205 (H) 11/14/2023   AST 96 (H) 11/14/2023   ALT 200 (H) 11/14/2023   ANIONGAP 11 11/14/2023    GFR: Estimated Creatinine Clearance: 77.8 mL/min (by C-G formula based on SCr of 0.87 mg/dL).  No results found for this or any  previous visit (from the past 240 hours).    Radiology Studies: MR BRAIN W WO CONTRAST Result Date: 11/13/2023 EXAM: MRI BRAIN WITH AND WITHOUT CONTRAST 11/13/2023 03:39:38 PM TECHNIQUE: Multiplanar  multisequence MRI of the head/brain was performed with and without the administration of intravenous contrast. COMPARISON: MRI head without contrast 11/06/2015. CLINICAL HISTORY: Headache, new onset (Age >= 51y); intermittent severe pain left head x 4 months. FINDINGS: BRAIN AND VENTRICLES: No acute infarct. No acute intracranial hemorrhage. No mass effect or midline shift. No hydrocephalus. The sella is unremarkable. Normal flow voids. No mass or abnormal enhancement. ORBITS: No acute abnormality. SINUSES: No acute abnormality. BONES AND SOFT TISSUES: Normal bone marrow signal and enhancement. No acute soft tissue abnormality. IMPRESSION: 1. No acute intracranial abnormality. 2. No mass or abnormal enhancement. Electronically signed by: Lonni Necessary MD 11/13/2023 04:30 PM EDT RP Workstation: [id number]   MR ABDOMEN MRCP W WO CONTAST Result Date: 11/13/2023 EXAM: MRCP WITH AND WITHOUT IV CONTRAST 11/12/2023 03:11:17 PM TECHNIQUE: Multisequence, multiplanar magnetic resonance images of the abdomen with and without intravenous contrast. MRCP sequences were performed. COMPARISON: Ultrasound 11/11/2023. CLINICAL HISTORY: Biliary obstruction suspected (Ped 0-17y); Probable common bile duct stones. FINDINGS: LIVER: No enhancing liver lesions identified. Diffuse periportal edema. GALLBLADDER AND BILIARY SYSTEM: Multiple gallstones are identified measuring around 6 mm, image 34/4. There is diffuse gallbladder wall enhancement and trace pericholecystic fluid. No significant gallbladder wall thickening. Dilatation of the cystic duct and common bile duct is noted with moderate intrahepatic bile duct dilatation. The common bile duct measures up to 1.2 cm. Numerous stones are identified throughout the common bile duct all measuring up to 8 mm. SPLEEN: Unremarkable. PANCREAS/PANCREATIC DUCT: Normal appearance of the pancreas without signs of main duct dilatation, inflammation or mass. ADRENAL GLANDS: Unremarkable.  KIDNEYS: Unremarkable. LYMPH NODES: No enlarged abdominal lymph nodes. VASULATURE: Unremarkable. PERITONEUM: No ascites. ABDOMINAL WALL: No hernia. No mass. BOWEL: Grossly unremarkable. No bowel obstruction. BONES: No enhancing bone lesions. SOFT TISSUES: Unremarkable. MISCELLANEOUS: Unremarkable. IMPRESSION: 1. Multiple gallstones and numerous stones throughout the common bile duct, with associated dilatation of the cystic duct and common bile duct (up to 1.2 cm) and moderate intrahepatic bile duct dilatation. 2. Diffuse gallbladder wall enhancement and trace pericholecystic fluid without significant gallbladder wall thickening. 3. Diffuse periportal edema. Electronically signed by: Waddell Calk MD 11/13/2023 05:12 AM EDT RP Workstation: [id number]   MR 3D Recon At Scanner Result Date: 11/13/2023 EXAM: MRCP WITH AND WITHOUT IV CONTRAST 11/12/2023 03:11:17 PM TECHNIQUE: Multisequence, multiplanar magnetic resonance images of the abdomen with and without intravenous contrast. MRCP sequences were performed. COMPARISON: Ultrasound 11/11/2023. CLINICAL HISTORY: Biliary obstruction suspected (Ped 0-17y); Probable common bile duct stones. FINDINGS: LIVER: No enhancing liver lesions identified. Diffuse periportal edema. GALLBLADDER AND BILIARY SYSTEM: Multiple gallstones are identified measuring around 6 mm, image 34/4. There is diffuse gallbladder wall enhancement and trace pericholecystic fluid. No significant gallbladder wall thickening. Dilatation of the cystic duct and common bile duct is noted with moderate intrahepatic bile duct dilatation. The common bile duct measures up to 1.2 cm. Numerous stones are identified throughout the common bile duct all measuring up to 8 mm. SPLEEN: Unremarkable. PANCREAS/PANCREATIC DUCT: Normal appearance of the pancreas without signs of main duct dilatation, inflammation or mass. ADRENAL GLANDS: Unremarkable. KIDNEYS: Unremarkable. LYMPH NODES: No enlarged abdominal lymph nodes.  VASULATURE: Unremarkable. PERITONEUM: No ascites. ABDOMINAL WALL: No hernia. No mass. BOWEL: Grossly unremarkable. No bowel obstruction. BONES: No enhancing bone lesions. SOFT TISSUES: Unremarkable. MISCELLANEOUS: Unremarkable. IMPRESSION: 1. Multiple gallstones  and numerous stones throughout the common bile duct, with associated dilatation of the cystic duct and common bile duct (up to 1.2 cm) and moderate intrahepatic bile duct dilatation. 2. Diffuse gallbladder wall enhancement and trace pericholecystic fluid without significant gallbladder wall thickening. 3. Diffuse periportal edema. Electronically signed by: Waddell Calk MD 11/13/2023 05:12 AM EDT RP Workstation: [id number]      LOS: 3 days    Elgin Lam, MD [facility] 11/14/2023, 11:03 AM   If 7PM-7AM, please contact night-coverage [email or web address]

## 2023-11-14 NOTE — Progress Notes (Signed)
 eLink Physician-Brief Progress Note Patient Name: Dorise Gangi DOB: 1970/10/08 MRN: [id number]   Date of Service  11/14/2023  HPI/Events of Note  53 year old history of GERD, hypertension, migraines who initially presented to [facility] with abdominal pain found to have choledocholithiasis and cholecystitis that underwent cholecystectomy today and returned from the OR to the ICU for stridor and hypotension.  Vital signs within normal limits saturating 100% on 2 L of oxygen.  Results with some mild electrolyte abnormalities and resolving transaminitis.  Chest radiograph unremarkable.  eICU Interventions  Continue Zosyn   Continue empiric steroids for airway edema  DVT prophylaxis with SCDs, reinitiate chemoprophylaxis once appropriate per surgery GI prophylaxis not indicated DC pantoprazole  and famotidine    0304 -blood pressure is dropping since the cessation of fluids.  Now has wide pulse pressures.  1.8 L positive for the day, 3 L positive overall.  Repeat slow NS bolus.  Add norepinephrine  to maintain systolic greater than 90.  Discontinue losartan .  Intervention Category Evaluation Type: New Patient Evaluation  Meng Winterton 11/14/2023, 8:19 PM

## 2023-11-14 NOTE — Progress Notes (Signed)
 Progress Note: General Surgery Service   Chief Complaint/Subjective: In Preop  Objective: Vital signs in last 24 hours: Temp:  [97.7 F (36.5 C)-98.3 F (36.8 C)] 98.1 F (36.7 C) (09/15 1059) Pulse Rate:  [64-83] 83 (09/15 1059) Resp:  [16-20] 18 (09/15 1059) BP: (153-176)/(87-101) 161/93 (09/15 1059) SpO2:  [97 %-100 %] 100 % (09/15 1059) Weight:  [74.8 kg] 74.8 kg (09/15 1059) Last BM Date : 11/12/23  Intake/Output from previous day: 09/14 0701 - 09/15 0700 In: 236 [P.O.:236] Out: 2 [Urine:2] Intake/Output this shift: Total I/O In: -  Out: 150 [Urine:150]  Constitutional: NAD; conversant; no deformities Eyes: Moist conjunctiva; no lid lag; anicteric; PERRL Neck: Trachea midline; no thyromegaly Lungs: Normal respiratory effort; no tactile fremitus CV: RRR; no palpable thrills; no pitting edema GI: Abd soft, nontender; no palpable hepatosplenomegaly MSK: Normal range of motion of extremities; no clubbing/cyanosis Psychiatric: Appropriate affect; alert and oriented x3 Lymphatic: No palpable cervical or axillary lymphadenopathy  Lab Results: CBC  Recent Labs    11/13/23 0521 11/14/23 0313  WBC 5.0 6.5  HGB 14.6 14.0  HCT 42.8 41.3  PLT 145* 151   BMET Recent Labs    11/13/23 0521 11/14/23 0313  NA 138 138  K 3.5 3.3*  CL 107 104  CO2 22 23  GLUCOSE 94 116*  BUN 5* 10  CREATININE 0.79 0.87  CALCIUM 8.9 8.8*   PT/INR Recent Labs    11/13/23 0521  LABPROT 13.6  INR 1.0   ABG No results for input(s): PHART, HCO3 in the last 72 hours.  Invalid input(s): PCO2, PO2  Anti-infectives: Anti-infectives (From admission, onward)    Start     Dose/Rate Route Frequency Ordered Stop   11/12/23 0200  [MAR Hold]  piperacillin -tazobactam (ZOSYN ) IVPB 3.375 g        (MAR Hold since Mon 11/14/2023 at 1046.Hold Reason: Transfer to a Procedural area)   3.375 g 12.5 mL/hr over 240 Minutes Intravenous Every 8 hours 11/12/23 0131     11/11/23 1900   piperacillin -tazobactam (ZOSYN ) IVPB 3.375 g        3.375 g 100 mL/hr over 30 Minutes Intravenous  Once 11/11/23 1852 11/11/23 2016       Medications: Scheduled Meds:  [MAR Hold] losartan   50 mg Oral Daily   [MAR Hold] pantoprazole   40 mg Oral Daily   potassium chloride   40 mEq Oral Once   scopolamine   1 patch Transdermal Q72H   Continuous Infusions:  lactated ringers  10 mL/hr at 11/14/23 1127   [MAR Hold] piperacillin -tazobactam (ZOSYN )  IV 3.375 g (11/14/23 0829)   PRN Meds:.[MAR Hold] hydrALAZINE , [MAR Hold] ibuprofen , [MAR Hold]  morphine  injection, [MAR Hold] naLOXone  (NARCAN )  injection, [MAR Hold] ondansetron  (ZOFRAN ) IV, [MAR Hold] oxyCODONE   Assessment/Plan: Ruth Cooper is an 53 y.o. female with choledocholithiasis.  On my review of the imaging, the choledocholithiasis is quite impressive on MRI and I think she will definitely need ERCP at some point.  I also recommend laparoscopic cholecystectomy during this admission.  We discussed the procedure, its risks, benefits and alternatives.  I discussed logistics and scheduling with Dr. Nandigam.  We will plan for laparoscopic cholecystectomy today and ERCP to follow.    LOS: 3 days      Deward JINNY Foy, MD  [facility] Use [email or web address] to contact on call provider

## 2023-11-14 NOTE — Anesthesia Postprocedure Evaluation (Signed)
 Anesthesia Post Note  Patient: Ruth Cooper  Procedure(s) Performed: LAPAROSCOPIC CHOLECYSTECTOMY     Patient location during evaluation: PACU Anesthesia Type: General Level of consciousness: awake and alert Pain management: pain level controlled Vital Signs Assessment: post-procedure vital signs reviewed and stable Respiratory status: spontaneous breathing, nonlabored ventilation and respiratory function stable Cardiovascular status: blood pressure returned to baseline and stable Postop Assessment: no apparent nausea or vomiting Anesthetic complications: no   No notable events documented.  Last Vitals:  Vitals:   11/14/23 1430 11/14/23 1445  BP: (!) 151/66 (!) 147/72  Pulse: 72 66  Resp: 19 17  Temp:    SpO2: 100% 98%    Last Pain:  Vitals:   11/14/23 1458  TempSrc:   PainSc: 4                  Lauraine KATHEE [place]

## 2023-11-14 NOTE — Plan of Care (Signed)
 Upon arriving to room pt noted to have stridor 02100% RA b/p dropped notified MD of findings asked to come to bedside to observe stat CXR labs and pt to be trtansferred to ICU awaiting bed to open bolus given per order for low b/p Map remains WNL pt alert x4 family at bedside

## 2023-11-14 NOTE — Progress Notes (Signed)
 Transition of Care [facility]) - Inpatient Brief Assessment   Patient Details  Name: Ruth Cooper MRN: [id number] Date of Birth: 1970/04/27  Transition of Care [facility]) CM/SW Contact:    Rosaline JONELLE Joe, RN Phone Number: 11/14/2023, 3:58 PM   Clinical Narrative: Patient is S/P cholecystectomy.  No IP Care management needs at this time.   Transition of Care Asessment: Insurance and Status: (P) Insurance coverage has been reviewed Patient has primary care physician: (P) Yes Home environment has been reviewed: (P) from home Prior level of function:: (P) self Prior/Current Home Services: (P) No current home services Social Drivers of Health Review: (P) SDOH reviewed no interventions necessary Readmission risk has been reviewed: (P) Yes Transition of care needs: (P) no transition of care needs at this time

## 2023-11-14 NOTE — Anesthesia Procedure Notes (Signed)
 Procedure Name: Intubation Date/Time: 11/14/2023 1:10 PM  Performed by: Atanacio Arland HERO, CRNAPre-anesthesia Checklist: Patient identified, Emergency Drugs available, Suction available and Patient being monitored Patient Re-evaluated:Patient Re-evaluated prior to induction Oxygen Delivery Method: Circle System Utilized Preoxygenation: Pre-oxygenation with 100% oxygen Induction Type: IV induction Ventilation: Mask ventilation without difficulty Laryngoscope Size: Glidescope and 4 Grade View: Grade II Tube type: Oral Tube size: 7.0 mm Number of attempts: 1 Airway Equipment and Method: Stylet Placement Confirmation: ETT inserted through vocal cords under direct vision, positive ETCO2 and breath sounds checked- equal and bilateral Secured at: 22 cm Tube secured with: Tape Dental Injury: Teeth and Oropharynx as per pre-operative assessment

## 2023-11-15 ENCOUNTER — Inpatient Hospital Stay (HOSPITAL_COMMUNITY): Payer: Self-pay | Admitting: Certified Registered Nurse Anesthetist

## 2023-11-15 ENCOUNTER — Inpatient Hospital Stay (HOSPITAL_COMMUNITY)

## 2023-11-15 ENCOUNTER — Encounter (HOSPITAL_COMMUNITY)
Admission: EM | Disposition: A | Discharge status: Home / Self Care | Payer: Self-pay | Source: Home / Self Care | Attending: Family Medicine

## 2023-11-15 ENCOUNTER — Encounter (HOSPITAL_COMMUNITY): Payer: Self-pay | Admitting: Surgery

## 2023-11-15 DIAGNOSIS — Z9049 Acquired absence of other specified parts of digestive tract: Secondary | ICD-10-CM

## 2023-11-15 DIAGNOSIS — N179 Acute kidney failure, unspecified: Secondary | ICD-10-CM

## 2023-11-15 DIAGNOSIS — K838 Other specified diseases of biliary tract: Secondary | ICD-10-CM

## 2023-11-15 DIAGNOSIS — K2960 Other gastritis without bleeding: Secondary | ICD-10-CM

## 2023-11-15 DIAGNOSIS — R7989 Other specified abnormal findings of blood chemistry: Secondary | ICD-10-CM

## 2023-11-15 DIAGNOSIS — K810 Acute cholecystitis: Secondary | ICD-10-CM

## 2023-11-15 HISTORY — PX: ERCP: SHX5425

## 2023-11-15 LAB — COMPREHENSIVE METABOLIC PANEL WITH GFR
ALT: 131 U/L — ABNORMAL HIGH (ref 0–44)
AST: 57 U/L — ABNORMAL HIGH (ref 15–41)
Albumin: 3 g/dL — ABNORMAL LOW (ref 3.5–5.0)
Alkaline Phosphatase: 140 U/L — ABNORMAL HIGH (ref 38–126)
Anion gap: 9 (ref 5–15)
BUN: 20 mg/dL (ref 6–20)
CO2: 23 mmol/L (ref 22–32)
Calcium: 7.6 mg/dL — ABNORMAL LOW (ref 8.9–10.3)
Chloride: 101 mmol/L (ref 98–111)
Creatinine, Ser: 1.49 mg/dL — ABNORMAL HIGH (ref 0.44–1.00)
GFR, Estimated: 42 mL/min — ABNORMAL LOW (ref >60)
Glucose, Bld: 140 mg/dL — ABNORMAL HIGH (ref 70–99)
Potassium: 3.9 mmol/L (ref 3.5–5.1)
Sodium: 133 mmol/L — ABNORMAL LOW (ref 135–145)
Total Bilirubin: 0.9 mg/dL (ref 0.0–1.2)
Total Protein: 5.4 g/dL — ABNORMAL LOW (ref 6.5–8.1)

## 2023-11-15 LAB — CBC
HCT: 29.9 % — ABNORMAL LOW (ref 36.0–46.0)
Hemoglobin: 9.9 g/dL — ABNORMAL LOW (ref 12.0–15.0)
MCH: 31.2 pg (ref 26.0–34.0)
MCHC: 33.1 g/dL (ref 30.0–36.0)
MCV: 94.3 fL (ref 80.0–100.0)
Platelets: 181 K/uL (ref 150–400)
RBC: 3.17 MIL/uL — ABNORMAL LOW (ref 3.87–5.11)
RDW: 13.9 % (ref 11.5–15.5)
WBC: 11.7 K/uL — ABNORMAL HIGH (ref 4.0–10.5)
nRBC: 0 % (ref 0.0–0.2)

## 2023-11-15 SURGERY — ERCP, WITH INTERVENTION IF INDICATED
Anesthesia: General

## 2023-11-15 MED ORDER — LACTATED RINGERS IV SOLN: INTRAVENOUS | Status: DC

## 2023-11-15 MED ORDER — PROPOFOL 10 MG/ML IV BOLUS
INTRAVENOUS | Status: DC | PRN
  Administered 2023-11-15: 150 mg via INTRAVENOUS

## 2023-11-15 MED ORDER — ONDANSETRON HCL 4 MG/2ML IJ SOLN
INTRAMUSCULAR | Status: DC | PRN
  Administered 2023-11-15: 4 mg via INTRAVENOUS

## 2023-11-15 MED ORDER — SODIUM CHLORIDE 0.9 % IV SOLN: 250.0000 mL | INTRAVENOUS | Status: DC

## 2023-11-15 MED ORDER — CIPROFLOXACIN IN D5W 400 MG/200ML IV SOLN
INTRAVENOUS | Status: AC
  Filled 2023-11-15: qty 200

## 2023-11-15 MED ORDER — SODIUM CHLORIDE 0.9 % IV BOLUS
1000.0000 mL | Freq: Once | INTRAVENOUS | Status: AC
Start: 2023-11-15 — End: 2023-11-15
  Administered 2023-11-15: 1000 mL via INTRAVENOUS

## 2023-11-15 MED ORDER — FENTANYL CITRATE (PF) 250 MCG/5ML IJ SOLN
INTRAMUSCULAR | Status: DC | PRN
  Administered 2023-11-15 (×2): 50 ug via INTRAVENOUS

## 2023-11-15 MED ORDER — GLUCAGON HCL RDNA (DIAGNOSTIC) 1 MG IJ SOLR
INTRAMUSCULAR | Status: DC | PRN
  Administered 2023-11-15 (×2): .25 mg via INTRAVENOUS

## 2023-11-15 MED ORDER — GLUCAGON HCL RDNA (DIAGNOSTIC) 1 MG IJ SOLR
INTRAMUSCULAR | Status: AC
  Filled 2023-11-15: qty 1

## 2023-11-15 MED ORDER — PHENYLEPHRINE HCL-NACL 20-0.9 MG/250ML-% IV SOLN
INTRAVENOUS | Status: DC | PRN
  Administered 2023-11-15: 25 ug/min via INTRAVENOUS

## 2023-11-15 MED ORDER — DEXAMETHASONE SODIUM PHOSPHATE 10 MG/ML IJ SOLN
INTRAMUSCULAR | Status: DC | PRN
  Administered 2023-11-15: 5 mg via INTRAVENOUS

## 2023-11-15 MED ORDER — DICLOFENAC SUPPOSITORY 100 MG
RECTAL | Status: AC
  Filled 2023-11-15: qty 1

## 2023-11-15 MED ORDER — SODIUM CHLORIDE 0.9 % IV SOLN
INTRAVENOUS | Status: DC | PRN
  Administered 2023-11-15: 55 mL

## 2023-11-15 MED ORDER — PANTOPRAZOLE SODIUM 40 MG PO TBEC
40.0000 mg | DELAYED_RELEASE_TABLET | Freq: Every day | ORAL | Status: DC
  Administered 2023-11-15 – 2023-11-16 (×2): 40 mg via ORAL
  Filled 2023-11-15 (×2): qty 1

## 2023-11-15 MED ORDER — MIDAZOLAM HCL 2 MG/2ML IJ SOLN
INTRAMUSCULAR | Status: DC | PRN
  Administered 2023-11-15: 2 mg via INTRAVENOUS

## 2023-11-15 MED ORDER — MIDAZOLAM HCL 2 MG/2ML IJ SOLN
INTRAMUSCULAR | Status: AC
  Filled 2023-11-15: qty 2

## 2023-11-15 MED ORDER — PIPERACILLIN-TAZOBACTAM 3.375 G IVPB
3.3750 g | Freq: Three times a day (TID) | INTRAVENOUS | Status: AC
  Administered 2023-11-15 – 2023-11-16 (×4): 3.375 g via INTRAVENOUS
  Filled 2023-11-15 (×5): qty 50

## 2023-11-15 MED ORDER — ACETAMINOPHEN 325 MG PO TABS
650.0000 mg | ORAL_TABLET | Freq: Four times a day (QID) | ORAL | Status: DC | PRN
  Administered 2023-11-15: 650 mg via ORAL
  Filled 2023-11-15: qty 2

## 2023-11-15 MED ORDER — ROCURONIUM BROMIDE 10 MG/ML (PF) SYRINGE
PREFILLED_SYRINGE | INTRAVENOUS | Status: DC | PRN
  Administered 2023-11-15: 30 mg via INTRAVENOUS
  Administered 2023-11-15: 50 mg via INTRAVENOUS

## 2023-11-15 MED ORDER — LIDOCAINE 2% (20 MG/ML) 5 ML SYRINGE
INTRAMUSCULAR | Status: DC | PRN
  Administered 2023-11-15 (×2): 50 mg via INTRAVENOUS

## 2023-11-15 MED ORDER — INDOMETHACIN 50 MG RE SUPP: 100.0000 mg | Freq: Once | RECTAL | Status: DC

## 2023-11-15 MED ORDER — OXYCODONE HCL 5 MG PO TABS: 5.0000 mg | ORAL_TABLET | ORAL | Status: DC | PRN

## 2023-11-15 MED ORDER — SUGAMMADEX SODIUM 200 MG/2ML IV SOLN
INTRAVENOUS | Status: DC | PRN
  Administered 2023-11-15: 200 mg via INTRAVENOUS

## 2023-11-15 MED ORDER — INDOMETHACIN 50 MG RE SUPP
RECTAL | Status: AC
  Filled 2023-11-15: qty 2

## 2023-11-15 MED ORDER — NOREPINEPHRINE 4 MG/250ML-% IV SOLN
0.0000 ug/min | INTRAVENOUS | Status: DC
  Filled 2023-11-15: qty 250

## 2023-11-15 MED ORDER — DICLOFENAC SUPPOSITORY 100 MG
RECTAL | Status: DC | PRN
Start: 2023-11-15 — End: 2023-11-15
  Administered 2023-11-15: 100 mg via RECTAL

## 2023-11-15 MED ORDER — FENTANYL CITRATE (PF) 100 MCG/2ML IJ SOLN
INTRAMUSCULAR | Status: AC
  Filled 2023-11-15: qty 2

## 2023-11-15 NOTE — Anesthesia Procedure Notes (Signed)
 Procedure Name: Intubation Date/Time: 11/15/2023 1:01 PM  Performed by: Harrold Macintosh, CRNAPre-anesthesia Checklist: Patient identified, Emergency Drugs available, Suction available and Patient being monitored Patient Re-evaluated:Patient Re-evaluated prior to induction Oxygen Delivery Method: Circle system utilized Preoxygenation: Pre-oxygenation with 100% oxygen Induction Type: IV induction Ventilation: Mask ventilation without difficulty Laryngoscope Size: Glidescope and 3 Grade View: Grade I Tube type: Oral Tube size: 7.0 mm Number of attempts: 1 Airway Equipment and Method: Stylet, LTA kit utilized and Video-laryngoscopy Placement Confirmation: ETT inserted through vocal cords under direct vision, positive ETCO2 and breath sounds checked- equal and bilateral Secured at: 21 cm Tube secured with: Tape Dental Injury: Teeth and Oropharynx as per pre-operative assessment  Difficulty Due To: Difficulty was anticipated, Difficult Airway-  due to neck instability and Difficult Airway- due to limited oral opening Comments: Erythema of the right corniculate cartilage observed prior to intubation

## 2023-11-15 NOTE — Op Note (Signed)
 [facility] Patient Name: Ruth Cooper Procedure Date : 11/15/2023 MRN: [id number] Attending MD: Aloha Finner , MD, [id number] Date of Birth: 06/04/1970 CSN: [id number] Age: 53 Admit Type: Inpatient Procedure:                ERCP Indications:              Bile duct stone(s), Postop exam: Cholecystectomy Providers:                Aloha Finner, MD, Hoy Penner, RN,                            [facility] [place], Technician, Avelina Harrold BONINE Referring MD:              Medicines:                General Anesthesia, Diclofenac  100 mg rectal, Zosyn                             3.375 g IV, Glucagon  0.5 mg IV Complications:            No immediate complications. Estimated Blood Loss:     Estimated blood loss was minimal. Procedure:                Pre-Anesthesia Assessment:                           - Prior to the procedure, a History and Physical                            was performed, and patient medications and                            allergies were reviewed. The patient's tolerance of                            previous anesthesia was also reviewed. The risks                            and benefits of the procedure and the sedation                            options and risks were discussed with the patient.                            All questions were answered, and informed consent                            was obtained. Prior Anticoagulants: The patient has                            taken no anticoagulant or antiplatelet agents                            except for aspirin. ASA Grade Assessment: III - A  patient with severe systemic disease. After                            reviewing the risks and benefits, the patient was                            deemed in satisfactory condition to undergo the                            procedure.                           After obtaining informed consent, the scope was                             passed under direct vision. Throughout the                            procedure, the patient's blood pressure, pulse, and                            oxygen saturations were monitored continuously. The                            [employer] D single use                            duodenoscope was introduced through the mouth, and                            used to inject contrast into and used to inject                            contrast into the bile duct. The ERCP was                            accomplished without difficulty. The patient                            tolerated the procedure. Scope In: Scope Out: Findings:      A scout film of the abdomen was obtained. Surgical clips, consistent       with a previous cholecystectomy, were seen in the area of the right       upper quadrant of the abdomen.      The upper GI tract was traversed under direct vision without detailed       examination. Multiple dispersed small erosions with no bleeding and no       stigmata of recent bleeding were found in the entire examined stomach -       biopsied for HP evaluation. No gross lesions were noted in the duodenal       bulb, in the first portion of the duodenum and in the second portion of       the duodenum. The major papilla was normal.      A 0.035 inch x 260 cm  straight Hydra Raymon was passed into the biliary       tree. The Hydratome sphincterotome was passed over the guidewire and the       bile duct was then deeply cannulated. Contrast was injected. I       personally interpreted the bile duct images. Ductal flow of contrast was       adequate. Image quality was adequate. Contrast extended to the hepatic       ducts. Opacification of the entire biliary tree except for the       gallbladder was successful. The main bile duct was moderately dilated.       The largest diameter was 12 mm. The main bile duct and cystic duct       contained filling defects  thought to be stones and sludge. A 10 mm       biliary sphincterotomy was made with a monofilament Hydratome       sphincterotome using ERBE electrocautery. There was no       post-sphincterotomy bleeding. To discover objects, the biliary tree was       swept with a retrieval balloon distally to proximally and eventually       into the right and left main hepatic ducts and then in to the cystic       duct. Sludge was swept from the duct. Greater than 20 stones were       removed. No stones remained. An occlusion cholangiogram was performed       that showed no further significant biliary pathology.      A pancreatogram was not performed.      The duodenoscope was withdrawn from the patient. Impression:               - Erosive gastropathy with no bleeding and no                            stigmata of recent bleeding. Biopsied for HP                            evaluation.                           - No gross lesions in the duodenal bulb, in the                            first portion of the duodenum and in the second                            portion of the duodenum.                           - The major papilla appeared normal.                           - Filling defects consistent with stones and sludge                            was seen on the cholangiogram.                           -  The entire main bile duct was moderately dilated.                           - Choledocholithiasis was found (greater than 20                            stones). Complete removal was accomplished by                            biliary sphincterotomy and balloon sweep. Recommendation:           - The patient will be observed post-procedure,                            until all discharge criteria are met.                           - Return patient to hospital ward for ongoing care.                           - Advance diet as tolerated.                           - Observe patient's clinical course.                            - Check liver enzymes (AST, ALT, alkaline                            phosphatase, bilirubin) in the morning.                           - Watch for pancreatitis, bleeding, perforation,                            and cholangitis.                           - Start PPI 40 mg daily.                           - Await path results.                           - Would hold chemical VTE prophylaxis for at least                            24 hours to decrease risk of post-interventional                            bleeding. If anticoagulation is necessary consider                            heparin drip without bolus in 6-12 hours and  monitor closely.                           - The findings and recommendations were discussed                            with the patient.                           - The findings and recommendations were discussed                            with the referring physician. Procedure Code(s):        --- Professional ---                           [id number], Endoscopic retrograde                            cholangiopancreatography (ERCP); with removal of                            calculi/debris from biliary/pancreatic duct(s)                           43262, Endoscopic retrograde                            cholangiopancreatography (ERCP); with                            sphincterotomy/papillotomy                           74328, 26, Endoscopic catheterization of the                            biliary ductal system, radiological supervision and                            interpretation Diagnosis Code(s):        --- Professional ---                           K31.89, Other diseases of stomach and duodenum                           K80.50, Calculus of bile duct without cholangitis                            or cholecystitis without obstruction                           Z09, Encounter for follow-up examination after                             completed treatment for conditions other than  malignant neoplasm                           Z90.49, Acquired absence of other specified parts                            of digestive tract                           K83.8, Other specified diseases of biliary tract                           R93.2, Abnormal findings on diagnostic imaging of                            liver and biliary tract CPT copyright 2022 [employer]. All rights reserved. The codes documented in this report are preliminary and upon coder review may  be revised to meet current compliance requirements. Aloha Finner, MD 11/15/2023 2:05:18 PM Number of Addenda: 0

## 2023-11-15 NOTE — Progress Notes (Signed)
 Progress Note: General Surgery Service   Chief Complaint/Subjective: Developed stridor and hypotension overnight now in ICU and stable, breathing comfortably.  She felt panic when she couldn't breath.  She has had a hard time getting her pain controlled.  Objective: Vital signs in last 24 hours: Temp:  [97.5 F (36.4 C)-98.1 F (36.7 C)] 98 F (36.7 C) (09/16 0400) Pulse Rate:  [58-90] 58 (09/16 0600) Resp:  [10-30] 15 (09/16 0600) BP: (75-161)/(37-93) 99/71 (09/16 0600) SpO2:  [94 %-100 %] 100 % (09/16 0600) Weight:  [74.4 kg-74.8 kg] 74.4 kg (09/15 1950) Last BM Date : 11/12/23 (pre op)  Intake/Output from previous day: 09/15 0701 - 09/16 0700 In: 2029.2 [I.V.:1000; IV Piggyback:1029.2] Out: 165 [Urine:150; Blood:15] Intake/Output this shift: No intake/output data recorded.  Constitutional: NAD; conversant; no deformities Eyes: Moist conjunctiva; no lid lag; anicteric; PERRL Neck: Trachea midline; no thyromegaly Lungs: Normal respiratory effort; no tactile fremitus CV: RRR; no palpable thrills; no pitting edema GI: Abd soft, incisions c/d/I w/ glue and some bruising from the local with epinephrine  MSK: Normal range of motion of extremities; no clubbing/cyanosis Psychiatric: Appropriate affect; alert and oriented x3 Lymphatic: No palpable cervical or axillary lymphadenopathy  Lab Results: CBC  Recent Labs    11/14/23 2112 11/15/23 0549  WBC 11.2* 11.7*  HGB 11.8* 9.9*  HCT 35.3* 29.9*  PLT 198 181   BMET Recent Labs    11/14/23 0313 11/15/23 0549  NA 138 133*  K 3.3* 3.9  CL 104 101  CO2 23 23  GLUCOSE 116* 140*  BUN 10 20  CREATININE 0.87 1.49*  CALCIUM 8.8* 7.6*   PT/INR Recent Labs    11/13/23 0521  LABPROT 13.6  INR 1.0   ABG No results for input(s): PHART, HCO3 in the last 72 hours.  Invalid input(s): PCO2, PO2  Anti-infectives: Anti-infectives (From admission, onward)    Start     Dose/Rate Route Frequency Ordered Stop    11/12/23 0200  piperacillin -tazobactam (ZOSYN ) IVPB 3.375 g        3.375 g 12.5 mL/hr over 240 Minutes Intravenous Every 8 hours 11/12/23 0131     11/11/23 1900  piperacillin -tazobactam (ZOSYN ) IVPB 3.375 g        3.375 g 100 mL/hr over 30 Minutes Intravenous  Once 11/11/23 1852 11/11/23 2016       Medications: Scheduled Meds:  Chlorhexidine  Gluconate Cloth  6 each Topical Daily   dexamethasone  (DECADRON ) injection  4 mg Intravenous Q12H   Continuous Infusions:  sodium chloride  Stopped (11/15/23 0334)   lactated ringers      norepinephrine  (LEVOPHED ) Adult infusion     piperacillin -tazobactam (ZOSYN )  IV 3.375 g (11/15/23 0118)   PRN Meds:.hydrALAZINE , HYDROmorphone  (DILAUDID ) injection, naLOXone  (NARCAN )  injection, ondansetron  (ZOFRAN ) IV  Assessment/Plan: Luevenia Mcavoy is an 53 y.o. female with choledocholithiasis.  She underwent laparoscopic cholecystectomy on 11/14/23.  Postoperative hypotension and stridor required transfer to the ICU.   Plan for ERCP when safe from a pulmonary standpoint Pain control Increase activity Follow up pathology    LOS: 4 days      Deward JINNY Foy, MD  [facility] Use [email or web address] to contact on call provider

## 2023-11-15 NOTE — Progress Notes (Signed)
 eLink Physician-Brief Progress Note Patient Name: Ruth Cooper DOB: 1970/03/17 MRN: [id number]   Date of Service  11/15/2023  HPI/Events of Note  Notified about soft blood pressures, asymptomatic  eICU Interventions  Trend reviewed, seems to be sustaining systolics mostly in the 90s-100s.  No intervention for the time being unless sustaining less than systolic 80 or symptomatic     Intervention Category Intermediate Interventions: Hypotension - evaluation and management  Nasiah Lehenbauer 11/15/2023, 11:43 PM

## 2023-11-15 NOTE — Transfer of Care (Signed)
 Immediate Anesthesia Transfer of Care Note  Patient: Ruth Cooper  Procedure(s) Performed: ERCP, WITH INTERVENTION IF INDICATED  Patient Location: Endoscopy Unit  Anesthesia Type:GA  Level of Consciousness: awake, alert , and oriented  Airway & Oxygen Therapy: Patient Spontanous Breathing  Post-op Assessment: Report given to RN, Post -op Vital signs reviewed and stable, Patient moving all extremities X 4, and Patient able to stick tongue midline  Post vital signs: Reviewed and stable  Last Vitals:  Vitals Value Taken Time  BP 99/50 11/15/23 14:10  Temp 36.4 C 11/15/23 14:10  Pulse 74 11/15/23 14:14  Resp 11 11/15/23 14:14  SpO2 95 % 11/15/23 14:14  Vitals shown include unfiled device data.  Last Pain:  Vitals:   11/15/23 1410  TempSrc: Temporal  PainSc:          Complications: No notable events documented.

## 2023-11-15 NOTE — Interval H&P Note (Signed)
 History and Physical Interval Note:  11/15/2023 12:31 PM  Ruth Cooper  has presented today for surgery, with the diagnosis of Common bile duct stones, cholelithiasis, possible cholecystitis.  The various methods of treatment have been discussed with the patient and family. After consideration of risks, benefits and other options for treatment, the patient has consented to  Procedure(s): ERCP, WITH INTERVENTION IF INDICATED (N/A) as a surgical intervention.  The patient's history has been reviewed, patient examined, no change in status, stable for surgery.  I have reviewed the patient's chart and labs.  Questions were answered to the patient's satisfaction.    The risks of an ERCP were discussed at length, including but not limited to the risk of perforation, bleeding, abdominal pain, post-ERCP pancreatitis (while usually mild can be severe and even life threatening).    Ebrima Ranta Mansouraty Jr

## 2023-11-15 NOTE — Progress Notes (Signed)
 NAME:  Ruth Cooper, MRN:  [id number], DOB:  31-Aug-1970, LOS: 4 ADMISSION DATE:  11/11/2023, CONSULTATION DATE:  11/14/23 REFERRING MD:  Briana CHIEF COMPLAINT:  Abd pain   History of Present Illness:  Ruth Cooper is a 53 y.o. female who has a PMH as outlined below.  She presented to [facility] ED on 9/13 with abdominal pain.  Symptoms had been going on for 10 days or worse postprandial.  In ED, she had right upper quadrant ultrasound that demonstrated hepatic steatosis, mildly distended CBD with intrahepatic biliary dilation suggesting distal obstruction.  She was evaluated by GI who performed MRCP that confirmed multiple gallstones and stones throughout the CBD with dilatation of the cystic duct and CBD.  Laparoscopic cholecystectomy was recommended.  On 9/15, she was taken to the OR by general surgery and had laparoscopic cholecystectomy.  Later that evening, she had hypotension and stridor.  She received 1 L of saline without improvement in BP and also had ongoing stridor.  PCCM subsequently consulted for assistance.  She is to be transferred to the ICU for ongoing monitoring.  Pertinent  Medical History:  has Knee pain, right; Migraine without aura and without status migrainosus, not intractable; Memory loss; Benign microscopic hematuria; HTN (hypertension); Frequent headaches; Ascending aorta dilatation (HCC); COVID-19 virus infection; Choledocholithiasis; and GERD (gastroesophageal reflux disease) on their problem list.   Significant Hospital Events: Including procedures, antibiotic start and stop dates in addition to other pertinent events   9/13 admit, GI consult and MRCP confirmed multiple gallstones and stones throughout CBD with dilatation of the cystic duct and CBD. 9/15 lap chole, hypotensive after, PCCM consult for stridor   Interim History / Subjective:  Patient stated continued to have abdominal pain Denies shortness of breath or chest pain Stated  breathing is much better  Objective:  Blood pressure 99/71, pulse (!) 58, temperature 98 F (36.7 C), temperature source Oral, resp. rate 15, height 5' 6 (1.676 m), weight 74.4 kg, SpO2 100%.        Intake/Output Summary (Last 24 hours) at 11/15/2023 0757 Last data filed at 11/14/2023 2300 Gross per 24 hour  Intake 2029.17 ml  Output 165 ml  Net 1864.17 ml   Filed Weights   11/11/23 1731 11/14/23 1059 11/14/23 1950  Weight: 75.9 kg 74.8 kg 74.4 kg     Physical Exam: General: Middle-aged female, lying on the bed HEENT: [place]/AT, eyes anicteric.  moist mucus membranes.  No stridor Neuro: Alert, awake following commands Chest: Coarse breath sounds, no wheezes or rhonchi Heart: Regular rate and rhythm, no murmurs or gallops Abdomen: Soft, tender in the right upper quadrant, nondistended, bowel sounds present  Labs and images reviewed  Patient Lines/Drains/Airways Status     Active Line/Drains/Airways     Name Placement date Placement time Site Days   Peripheral IV 11/11/23 20 G Right Antecubital 11/11/23  1755  Antecubital  4   Peripheral IV 11/15/23 20 G 1.88 Anterior;Left Forearm 11/15/23  0407  Forearm  less than 1   Wound 11/14/23 1401 Surgical Laparoscopic 11/14/23  1401  --  1   Wound 11/14/23 1401 Surgical Closed Surgical Incision Abdomen 11/14/23  1401  Abdomen  1         Labs/imaging personally reviewed:  MRCP 9/13 >  multiple gallstones and stones throughout CBD with dilatation of the cystic duct and CBD.  Chest xray 9/15 clear no acute cardiopulmonary process  Assessment & Plan:  Post-intubation stridor, improved Likely secondary to endotracheal  intubation earlier today for laparascopic cholecystectomy No history of smoking Currently she does not have a stridor but she is going for ERCP, may require intubation Will continue Decadron , famotidine  and Protonix  After procedure if she does not have stridor, will transfer out  Severe sepsis 2/2  cholecystitis/choledocholithiasis Patient underwent laparoscopic cholecystectomy She was slightly hypotensive postprocedure, now resolved Continue IV Zosyn   Acute kidney injury due to dehydration Patient serum creatinine trended up to 1.4 Started on IV fluid Monitor intake and output Avoid nephrotoxic agent   Labs   CBC: Recent Labs  Lab 11/11/23 1750 11/12/23 0414 11/13/23 0521 11/14/23 0313 11/14/23 2112 11/15/23 0549  WBC 10.7* 8.6 5.0 6.5 11.2* 11.7*  NEUTROABS 9.8*  --  3.6  --   --   --   HGB 14.6 14.2 14.6 14.0 11.8* 9.9*  HCT 43.5 41.2 42.8 41.3 35.3* 29.9*  MCV 91.0 90.5 91.5 92.2 92.9 94.3  PLT 163 147* 145* 151 198 181    Basic Metabolic Panel: Recent Labs  Lab 11/11/23 1750 11/12/23 0414 11/13/23 0521 11/14/23 0313 11/15/23 0549  NA 140 137 138 138 133*  K 3.7 3.2* 3.5 3.3* 3.9  CL 104 104 107 104 101  CO2 24 22 22 23 23   GLUCOSE 139* 114* 94 116* 140*  BUN 12 7 5* 10 20  CREATININE 0.80 0.69 0.79 0.87 1.49*  CALCIUM 9.0 8.5* 8.9 8.8* 7.6*  MG  --  1.8  --   --   --    GFR: Estimated Creatinine Clearance: 45 mL/min (A) (by C-G formula based on SCr of 1.49 mg/dL (H)). Recent Labs  Lab 11/13/23 0521 11/14/23 0313 11/14/23 2112 11/15/23 0549  WBC 5.0 6.5 11.2* 11.7*  LATICACIDVEN  --   --  1.9  --     Liver Function Tests: Recent Labs  Lab 11/11/23 1750 11/12/23 0414 11/13/23 0521 11/14/23 0313 11/15/23 0549  AST 564* 294* 150* 96* 57*  ALT 508* 352* 264* 200* 131*  ALKPHOS 284* 205* 217* 205* 140*  BILITOT 1.9* 4.1* 3.0* 1.8* 0.9  PROT 7.0 6.2* 6.7 6.2* 5.4*  ALBUMIN 4.6 3.6 3.6 3.4* 3.0*   Recent Labs  Lab 11/11/23 1750  LIPASE 29   No results for input(s): AMMONIA in the last 168 hours.  ABG No results found for: PHART, PCO2ART, PO2ART, HCO3, TCO2, ACIDBASEDEF, O2SAT   Coagulation Profile: Recent Labs  Lab 11/13/23 0521  INR 1.0    Cardiac Enzymes: No results for input(s): CKTOTAL, CKMB,  CKMBINDEX, TROPONINI in the last 168 hours.  HbA1C: Hgb A1c MFr Bld  Date/Time Value Ref Range Status  09/22/2023 08:47 AM 5.5 4.6 - 6.5 % Final    Comment:    Glycemic Control Guidelines for People with Diabetes:Non Diabetic:  <6%Goal of Therapy: <7%Additional Action Suggested:  >8%   03/28/2023 02:37 PM 5.5 4.6 - 6.5 % Final    Comment:    Glycemic Control Guidelines for People with Diabetes:Non Diabetic:  <6%Goal of Therapy: <7%Additional Action Suggested:  >8%     CBG: No results for input(s): GLUCAP in the last 168 hours.    Valinda Novas, MD [facility] See Amion for pager If no response to pager, please call [phone] until 7pm After 7pm, Please call E-link [phone]

## 2023-11-15 NOTE — Anesthesia Preprocedure Evaluation (Addendum)
 Anesthesia Evaluation  Patient identified by MRN, date of birth, ID band Patient awake    Reviewed: Allergy & Precautions, NPO status , Patient's Chart, lab work & pertinent test results  Airway Mallampati: III  TM Distance: >3 FB Neck ROM: Full    Dental no notable dental hx.    Pulmonary    Pulmonary exam normal        Cardiovascular hypertension, Pt. on medications Normal cardiovascular exam  ECHO: 1. Left ventricular ejection fraction, by estimation, is 55 to 60%. Left  ventricular ejection fraction by 3D volume is 60 %. The left ventricle has  normal function. The left ventricle has no regional wall motion  abnormalities. Left ventricular diastolic   parameters were normal.   2. Right ventricular systolic function is normal. The right ventricular  size is normal.   3. The mitral valve is normal in structure. No evidence of mitral valve  regurgitation. No evidence of mitral stenosis.   4. The aortic valve is normal in structure. Aortic valve regurgitation is  not visualized. No aortic stenosis is present.   5. Aortic dilatation noted. There is mild dilatation of the ascending  aorta, measuring 40 mm.   6. The inferior vena cava is normal in size with greater than 50%  respiratory variability, suggesting right atrial pressure of 3 mmHg.     Neuro/Psych  Headaches    GI/Hepatic   Endo/Other    Renal/GU Renal disease     Musculoskeletal   Abdominal   Peds  Hematology  (+) Blood dyscrasia, anemia   Anesthesia Other Findings Common bile duct stones Cholelithiasis Possible cholecystitis  Reproductive/Obstetrics                              Anesthesia Physical Anesthesia Plan  ASA: 2  Anesthesia Plan: General   Post-op Pain Management:    Induction: Intravenous  PONV Risk Score and Plan: 3 and Ondansetron , Dexamethasone , Midazolam  and Treatment may vary due to age or medical  condition  Airway Management Planned: Oral ETT  Additional Equipment:   Intra-op Plan:   Post-operative Plan: Extubation in OR  Informed Consent: I have reviewed the patients History and Physical, chart, labs and discussed the procedure including the risks, benefits and alternatives for the proposed anesthesia with the patient or authorized representative who has indicated his/her understanding and acceptance.     Dental advisory given  Plan Discussed with: CRNA  Anesthesia Plan Comments:         Anesthesia Quick Evaluation

## 2023-11-16 ENCOUNTER — Encounter (HOSPITAL_COMMUNITY): Payer: Self-pay | Admitting: Gastroenterology

## 2023-11-16 ENCOUNTER — Encounter: Payer: Self-pay | Admitting: Family Medicine

## 2023-11-16 DIAGNOSIS — K2950 Unspecified chronic gastritis without bleeding: Secondary | ICD-10-CM

## 2023-11-16 DIAGNOSIS — R7401 Elevation of levels of liver transaminase levels: Secondary | ICD-10-CM

## 2023-11-16 DIAGNOSIS — K8050 Calculus of bile duct without cholangitis or cholecystitis without obstruction: Secondary | ICD-10-CM | POA: Diagnosis not present

## 2023-11-16 DIAGNOSIS — D649 Anemia, unspecified: Secondary | ICD-10-CM | POA: Diagnosis not present

## 2023-11-16 LAB — COMPREHENSIVE METABOLIC PANEL WITH GFR
ALT: 149 U/L — ABNORMAL HIGH (ref 0–44)
AST: 201 U/L — ABNORMAL HIGH (ref 15–41)
Albumin: 2.9 g/dL — ABNORMAL LOW (ref 3.5–5.0)
Alkaline Phosphatase: 258 U/L — ABNORMAL HIGH (ref 38–126)
Anion gap: 7 (ref 5–15)
BUN: 15 mg/dL (ref 6–20)
CO2: 23 mmol/L (ref 22–32)
Calcium: 8.1 mg/dL — ABNORMAL LOW (ref 8.9–10.3)
Chloride: 106 mmol/L (ref 98–111)
Creatinine, Ser: 0.93 mg/dL (ref 0.44–1.00)
GFR, Estimated: >60 mL/min (ref >60)
Glucose, Bld: 108 mg/dL — ABNORMAL HIGH (ref 70–99)
Potassium: 4.1 mmol/L (ref 3.5–5.1)
Sodium: 136 mmol/L (ref 135–145)
Total Bilirubin: 2.5 mg/dL — ABNORMAL HIGH (ref 0.0–1.2)
Total Protein: 5.3 g/dL — ABNORMAL LOW (ref 6.5–8.1)

## 2023-11-16 LAB — CBC
HCT: 24.5 % — ABNORMAL LOW (ref 36.0–46.0)
Hemoglobin: 8.2 g/dL — ABNORMAL LOW (ref 12.0–15.0)
MCH: 31.4 pg (ref 26.0–34.0)
MCHC: 33.5 g/dL (ref 30.0–36.0)
MCV: 93.9 fL (ref 80.0–100.0)
Platelets: 133 K/uL — ABNORMAL LOW (ref 150–400)
RBC: 2.61 MIL/uL — ABNORMAL LOW (ref 3.87–5.11)
RDW: 14.1 % (ref 11.5–15.5)
WBC: 8.1 K/uL (ref 4.0–10.5)
nRBC: 0 % (ref 0.0–0.2)

## 2023-11-16 LAB — SURGICAL PATHOLOGY

## 2023-11-16 MED ORDER — DOCUSATE SODIUM 100 MG PO CAPS
100.0000 mg | ORAL_CAPSULE | Freq: Two times a day (BID) | ORAL | Status: DC
  Administered 2023-11-16: 100 mg via ORAL
  Filled 2023-11-16 (×2): qty 1

## 2023-11-16 MED ORDER — POLYETHYLENE GLYCOL 3350 17 G PO PACK: 17.0000 g | PACK | Freq: Every day | ORAL | Status: DC | PRN

## 2023-11-16 NOTE — Progress Notes (Signed)
  Progress Note   Patient: Ruth Cooper FMW:[id number] DOB: Jun 12, 1970 DOA: 11/11/2023     5 DOS: the patient was seen and examined on 11/16/2023 at 8:01AM      Brief hospital course: 53 y.o. F with HTN, hx vertebral artery dissection who presented with abdominal pain and found to have  choledocholithiasis. GI consulted. MRCP confirmed choledocholithiasis with gallbladder wall thickening. General surgery consulted for cholecystectomy.     Assessment and Plan: Choledocholithiasis Severe sepsis secondary to acute cholecystitis S/p lap chole 9/15 and ERCP 9/16 Still BP soft, Hgb trending down - Transfer to progressive - Trend Hgb    Acute anemia Hgb dropped from 14 to 8 today.   - Trend Hgb  Postintubation stridor Resolved spontaneously  Hypertension BP low - Hold losartan   Hypokalemia Supplemented and resolved      Subjective: Patient has some soreness in her abdomen, but overall she is feeling okay, she has no dizziness with standing, no confusion or fever.     Physical Exam: BP (!) 103/44   Pulse 81   Temp 97.9 F (36.6 C) (Oral)   Resp 20   Ht 5' 6 (1.676 m)   Wt 74.4 kg   SpO2 100%   BMI 26.47 kg/m   Adult female, sitting up in chair, appears tired, RRR, no murmurs, no peripheral edema Respiratory rate normal, lungs clear without rales or wheezes Abdomen somewhat diffusely tender, with mild guarding, no ascites or distention, no rigidity or rebound Attention normal, affect appropriate, judgment and insight appear normal    Data Reviewed: Basic metabolic panel normal CBC shows worsening anemia, mild thrombocytopenia   Family Communication:     Disposition: Status is: Inpatient         Author: Lonni SHAUNNA Dalton, MD 11/16/2023 2:18 PM  For on call review [email or web address].

## 2023-11-16 NOTE — Plan of Care (Signed)
  Problem: Education: Goal: Knowledge of General Education information will improve Description: Including pain rating scale, medication(s)/side effects and non-pharmacologic comfort measures Outcome: Progressing   Problem: Health Behavior/Discharge Planning: Goal: Ability to manage health-related needs will improve Outcome: Progressing   Problem: Clinical Measurements: Goal: Will remain free from infection Outcome: Progressing   Problem: Activity: Goal: Risk for activity intolerance will decrease Outcome: Progressing   Problem: Coping: Goal: Level of anxiety will decrease Outcome: Progressing   Problem: Safety: Goal: Ability to remain free from injury will improve Outcome: Progressing

## 2023-11-16 NOTE — Anesthesia Postprocedure Evaluation (Signed)
 Anesthesia Post Note  Patient: Porchea Charrier  Procedure(s) Performed: ERCP, WITH INTERVENTION IF INDICATED     Patient location during evaluation: Endoscopy Anesthesia Type: General Level of consciousness: awake Pain management: pain level controlled Vital Signs Assessment: post-procedure vital signs reviewed and stable Respiratory status: spontaneous breathing, nonlabored ventilation and respiratory function stable Cardiovascular status: blood pressure returned to baseline and stable Postop Assessment: no apparent nausea or vomiting Anesthetic complications: no   No notable events documented.  Last Vitals:  Vitals:   11/16/23 0800 11/16/23 0809  BP:  (!) 107/54  Pulse:  83  Resp:  16  Temp: 36.6 C   SpO2:  100%    Last Pain:  Vitals:   11/16/23 0800  TempSrc: Oral  PainSc:                  Ramon Brant P Jendayi Berling

## 2023-11-16 NOTE — Progress Notes (Signed)
 Progress Note: General Surgery Service   Chief Complaint/Subjective: Feeling good this morning.  No stridor after ERCP.  Objective: Vital signs in last 24 hours: Temp:  [97.5 F (36.4 C)-98.5 F (36.9 C)] 98.1 F (36.7 C) (09/17 0400) Pulse Rate:  [68-93] 69 (09/17 0700) Resp:  [10-22] 19 (09/17 0700) BP: (81-127)/(44-73) 103/58 (09/17 0700) SpO2:  [94 %-100 %] 97 % (09/17 0700) Weight:  [74.4 kg] 74.4 kg (09/16 1235) Last BM Date : 11/12/23  Intake/Output from previous day: 09/16 0701 - 09/17 0700 In: 2506.6 [P.O.:400; I.V.:1384.4; IV Piggyback:722.2] Out: 0  Intake/Output this shift: No intake/output data recorded.  Constitutional: NAD; conversant; no deformities Eyes: Moist conjunctiva; no lid lag; anicteric; PERRL Neck: Trachea midline; no thyromegaly Lungs: Normal respiratory effort; no tactile fremitus CV: RRR; no palpable thrills; no pitting edema GI: Abd soft, incisions c/d/I w/ glue and some bruising from the local with epinephrine  MSK: Normal range of motion of extremities; no clubbing/cyanosis Psychiatric: Appropriate affect; alert and oriented x3 Lymphatic: No palpable cervical or axillary lymphadenopathy  Lab Results: CBC  Recent Labs    11/15/23 0549 11/16/23 0603  WBC 11.7* 8.1  HGB 9.9* 8.2*  HCT 29.9* 24.5*  PLT 181 133*   BMET Recent Labs    11/15/23 0549 11/16/23 0603  NA 133* 136  K 3.9 4.1  CL 101 106  CO2 23 23  GLUCOSE 140* 108*  BUN 20 15  CREATININE 1.49* 0.93  CALCIUM 7.6* 8.1*   PT/INR No results for input(s): LABPROT, INR in the last 72 hours.  ABG No results for input(s): PHART, HCO3 in the last 72 hours.  Invalid input(s): PCO2, PO2  Anti-infectives: Anti-infectives (From admission, onward)    Start     Dose/Rate Route Frequency Ordered Stop   11/15/23 1015  piperacillin -tazobactam (ZOSYN ) IVPB 3.375 g        3.375 g 12.5 mL/hr over 240 Minutes Intravenous Every 8 hours 11/15/23 0928 11/16/23 1100    11/12/23 0200  piperacillin -tazobactam (ZOSYN ) IVPB 3.375 g  Status:  Discontinued        3.375 g 12.5 mL/hr over 240 Minutes Intravenous Every 8 hours 11/12/23 0131 11/15/23 0928   11/11/23 1900  piperacillin -tazobactam (ZOSYN ) IVPB 3.375 g        3.375 g 100 mL/hr over 30 Minutes Intravenous  Once 11/11/23 1852 11/11/23 2016       Medications: Scheduled Meds:  Chlorhexidine  Gluconate Cloth  6 each Topical Daily   dexamethasone  (DECADRON ) injection  4 mg Intravenous Q12H   indomethacin   100 mg Rectal Once   pantoprazole   40 mg Oral Daily   Continuous Infusions:  piperacillin -tazobactam (ZOSYN )  IV Stopped (11/16/23 0643)   PRN Meds:.acetaminophen , hydrALAZINE , ondansetron  (ZOFRAN ) IV, oxyCODONE   Assessment/Plan: Ruth Cooper is an 53 y.o. female with choledocholithiasis.  She underwent laparoscopic cholecystectomy on 11/14/23.  Postoperative hypotension and stridor required transfer to the ICU.  ERCP completed 11/15/23 with sphincterotomy and duct cleared.  Gastritis noted on EGD with h. Pylori pending.  Pain control Increase activity Follow up pathology and h. Pylori results LFTs elevated this AM would like to see those decrease prior to discharge    LOS: 5 days      Deward JINNY Foy, MD  [facility] Use [email or web address] to contact on call provider

## 2023-11-16 NOTE — Progress Notes (Addendum)
 Daily Progress Note  DOA: 11/11/2023 Hospital Day: 6  Cc: Choledocholithiasis  ASSESSMENT    53 year old female with cholelithiasis status post-cholecystectomy 9/15.  Postoperatively developed hypotension and stridor requiring ICU monitoring.   Choledocholithiasis, status post ERCP with sphincterotomy and removal of stones.  TODAY: Liver chemistries up some overnight but she feels fine. No abdominal pain .   Erosive gastropathy Gastric biopsies showed  mild chronic active gastritis .  No metaplasia or dysplasia .  H. pylori testing in progress .  intestinal metaplasia, dysplasia, and malignancy.   Acute anemia Hgb down overnight from 9.9 to 8.2. Had sphincterotomy but has not had any overt GI bleeding no overt GI bleeding.   Principal Problem:   Choledocholithiasis Active Problems:   HTN (hypertension)   GERD (gastroesophageal reflux disease)   LFT elevation   Common bile duct dilation   Erosive gastritis    PLAN   H. pylori test ( gastric biopsy) pending. Am LFTs Am CBC  Subjective    No physical complaints.  No abdominal pain.  Up bathing   Objective   GI Studies:   ERCP 11/15/2023 - Erosive gastropathy with no bleeding and no stigmata of recent bleeding. Biopsied for HP evaluation. - No gross lesions in the duodenal bulb, in the first portion of the duodenum and in the second portion of the duodenum. - The major papilla appeared normal. - Filling defects consistent with stones and sludge was seen on the cholangiogram. - The entire main bile duct was moderately dilated. - Choledocholithiasis was found (greater than 20 stones). Complete removal was accomplished by biliary sphincterotomy and balloon sweep.   Recent Labs    11/14/23 2112 11/15/23 0549 11/16/23 0603  WBC 11.2* 11.7* 8.1  HGB 11.8* 9.9* 8.2*  HCT 35.3* 29.9* 24.5*  MCV 92.9 94.3 93.9  PLT 198 181 133*   No results for input(s): FOLATE, VITAMINB12, FERRITIN, TIBC, IRONPCTSAT  in the last 72 hours. Recent Labs    11/14/23 0313 11/15/23 0549 11/16/23 0603  NA 138 133* 136  K 3.3* 3.9 4.1  CL 104 101 106  CO2 23 23 23   GLUCOSE 116* 140* 108*  BUN 10 20 15   CREATININE 0.87 1.49* 0.93  CALCIUM 8.8* 7.6* 8.1*   Recent Labs    11/14/23 0313 11/15/23 0549 11/16/23 0603  PROT 6.2* 5.4* 5.3*  ALBUMIN 3.4* 3.0* 2.9*  AST 96* 57* 201*  ALT 200* 131* 149*  ALKPHOS 205* 140* 258*  BILITOT 1.8* 0.9 2.5*      Imaging:  DG CHEST PORT 1 VIEW CLINICAL DATA:  Bilateral rales per  EXAM: PORTABLE CHEST 1 VIEW  COMPARISON:  11/16/2017  FINDINGS: The cardiomediastinal contours are normal. Subsegmental atelectasis at the left lung base. Pulmonary vasculature is normal. No consolidation, pleural effusion, or pneumothorax. No acute osseous abnormalities are seen.  IMPRESSION: Subsegmental atelectasis at the left lung base.  Electronically Signed   By: Andrea Gasman M.D.   On: 11/14/2023 18:20     Scheduled inpatient medications:   Chlorhexidine  Gluconate Cloth  6 each Topical Daily   docusate sodium   100 mg Oral BID   indomethacin   100 mg Rectal Once   pantoprazole   40 mg Oral Daily   Continuous inpatient infusions:  PRN inpatient medications: acetaminophen , hydrALAZINE , ondansetron  (ZOFRAN ) IV, oxyCODONE , polyethylene glycol  Vital signs in last 24 hours: Temp:  [97.6 F (36.4 C)-98.5 F (36.9 C)] 97.9 F (36.6 C) (09/17 1200) Pulse Rate:  [69-93] 83 (09/17 1400)  Resp:  [16-22] 19 (09/17 1400) BP: (81-119)/(44-67) 108/56 (09/17 1400) SpO2:  [95 %-100 %] 99 % (09/17 1400) Last BM Date : 11/12/23  Intake/Output Summary (Last 24 hours) at 11/16/2023 1510 Last data filed at 11/16/2023 1300 Gross per 24 hour  Intake 1185.97 ml  Output --  Net 1185.97 ml    Intake/Output from previous day: 09/16 0701 - 09/17 0700 In: 2506.6 [P.O.:400; I.V.:1384.4; IV Piggyback:722.2] Out: 0  Intake/Output this shift: Total I/O In: 30.7 [IV  Piggyback:30.7] Out: -    Physical Exam:  General: Alert female in NAD Heart:  Regular rate and rhythm.  Pulmonary: Normal respiratory effort Abdomen: Soft, nondistended, nontender. Normal bowel sounds. Extremities: No lower extremity edema  Neurologic: Alert and oriented Psych: Pleasant. Cooperative     LOS: 5 days   Ruth Cooper ,NP 11/16/2023, 3:10 PM  --------------------------------------------------------------------------------------------------------------  I have taken a history, reviewed the chart and examined the patient. I performed a substantive portion of this encounter, including complete performance of at least one of the key components, in conjunction with the APP. I agree with the APP's note, impression and recommendations.  Patient feeling well today.  No abdominal pain.  No bowel movements. Liver enzymes, tbili increased. Agree with monitoring patient to trend enzymes and hgb before discharge.  If LAEs downtrending tomorrow and no concerning symptoms, should be able to go home tomorrow.  Junior Huezo E. Stacia, MD [facility]

## 2023-11-17 ENCOUNTER — Ambulatory Visit: Admitting: Family Medicine

## 2023-11-17 ENCOUNTER — Other Ambulatory Visit (HOSPITAL_COMMUNITY): Payer: Self-pay

## 2023-11-17 ENCOUNTER — Ambulatory Visit: Payer: Self-pay | Admitting: Gastroenterology

## 2023-11-17 DIAGNOSIS — K8050 Calculus of bile duct without cholangitis or cholecystitis without obstruction: Secondary | ICD-10-CM | POA: Diagnosis not present

## 2023-11-17 LAB — COMPREHENSIVE METABOLIC PANEL WITH GFR
ALT: 140 U/L — ABNORMAL HIGH (ref 0–44)
AST: 114 U/L — ABNORMAL HIGH (ref 15–41)
Albumin: 3 g/dL — ABNORMAL LOW (ref 3.5–5.0)
Alkaline Phosphatase: 225 U/L — ABNORMAL HIGH (ref 38–126)
Anion gap: 9 (ref 5–15)
BUN: 15 mg/dL (ref 6–20)
CO2: 27 mmol/L (ref 22–32)
Calcium: 8.4 mg/dL — ABNORMAL LOW (ref 8.9–10.3)
Chloride: 101 mmol/L (ref 98–111)
Creatinine, Ser: 0.71 mg/dL (ref 0.44–1.00)
GFR, Estimated: >60 mL/min (ref >60)
Glucose, Bld: 90 mg/dL (ref 70–99)
Potassium: 3.7 mmol/L (ref 3.5–5.1)
Sodium: 137 mmol/L (ref 135–145)
Total Bilirubin: 1.1 mg/dL (ref 0.0–1.2)
Total Protein: 5.3 g/dL — ABNORMAL LOW (ref 6.5–8.1)

## 2023-11-17 LAB — CBC
HCT: 24.3 % — ABNORMAL LOW (ref 36.0–46.0)
Hemoglobin: 8 g/dL — ABNORMAL LOW (ref 12.0–15.0)
MCH: 31.4 pg (ref 26.0–34.0)
MCHC: 32.9 g/dL (ref 30.0–36.0)
MCV: 95.3 fL (ref 80.0–100.0)
Platelets: 131 K/uL — ABNORMAL LOW (ref 150–400)
RBC: 2.55 MIL/uL — ABNORMAL LOW (ref 3.87–5.11)
RDW: 14.2 % (ref 11.5–15.5)
WBC: 5.1 K/uL (ref 4.0–10.5)
nRBC: 0 % (ref 0.0–0.2)

## 2023-11-17 LAB — SURGICAL PATHOLOGY

## 2023-11-17 MED ORDER — DOCUSATE SODIUM 100 MG PO CAPS: 100.0000 mg | ORAL_CAPSULE | Freq: Every day | ORAL | Status: AC | PRN

## 2023-11-17 MED ORDER — FERROUS SULFATE 325 (65 FE) MG PO TBEC: 325.0000 mg | DELAYED_RELEASE_TABLET | ORAL | Status: AC

## 2023-11-17 MED ORDER — OXYCODONE HCL 5 MG PO TABS
5.0000 mg | ORAL_TABLET | ORAL | 0 refills | Status: AC | PRN
  Filled 2023-11-17: qty 15, 3d supply, fill #0

## 2023-11-17 NOTE — Plan of Care (Signed)

## 2023-11-17 NOTE — Discharge Summary (Signed)
 Physician Discharge Summary   Patient: Ruth Cooper MRN: [id number] DOB: October 25, 1970  Admit date:     11/11/2023  Discharge date: 11/17/23  Discharge Physician: Lonni SHAUNNA Dalton   PCP: Watt Harlene BROCKS, MD     Recommendations at discharge:  Follow up with [facility] for cholecystectomy as directed Follow up with PCP Dr. Watt in 1 week Dr. Watt: Please obtain CBC and CMP in 1 week (discharge Cr 0.7, AST/ALT 114/140, and Hgb 8.0 g/dL) Trend Hgb after that as appropriate Please resume losartan  when appropriate     Discharge Diagnoses: Principal Problem:   Cholecystitis due to choledocholithiasis Active Problems:   Acute blood loss anemia, likely multifactorial   HTN (hypertension)   Sepsis ruled out       Hospital Course: 53 y.o. F with HTN, hx vertebral artery dissection who presented with abdominal pain and found to have  choledocholithiasis. GI consulted. MRCP confirmed choledocholithiasis with gallbladder wall thickening. General surgery consulted for cholecystectomy.      Choledocholithiasis Acute cholecystitis S/p lap chole 9/15 and ERCP 9/16 Patient admitted and GI consulted given CT findings on admission.   MRCP confirmed choledocholithiasis and General Surgery were consulted.  Post-operatively, she had hypotension and stridor and was transferred to ICU.  Underwent ERCP at that point that showed many small stones in the CBD, cleared with sphincterotomy and balloon.    Post-ERCP, had small LFT bump, which was improving at the time of discharge.  BP improved, transferred OOU.  Hgb there remained stable, ambulating and tolerating diet, discharged to home with PCP follow up.          Acute anemia Hgb dropped 14 to 8 from prior to surgery to after ERCP.  Likely some intra-op bleeding, some intra-ERCP bleeding, some dilution and phlebotomy.  In last 24 hours, has stabilized.  Discussed with Gen Surg, based on available information,  high confidence she is not actively bleeding.   She is asymptomatic, balance of benefit and harm favors conservative management, outpatient follow up without transfusion at this time.    Postintubation stridor Resolved spontaneously   Hypertension BP soft.  Losartan  held at discharge.   Hypokalemia Supplemented and resolved               The [place]  Controlled Substances Registry was reviewed for this patient prior to discharge.   Consultants: General surgery Dr. Lyndel Gastroenterology Dr. Stacia  Procedures performed: ERCP Laparoscopic cholecystectomy   Disposition: Home Diet recommendation:  Discharge Diet Orders (From admission, onward)     Start     Ordered   11/17/23 0000  Diet - low sodium heart healthy        11/17/23 1008             DISCHARGE MEDICATION: Allergies as of 11/17/2023       Reactions   Trazodone  Other (See Comments)   Excessive drowsiness Confusion Dizziness         Medication List     PAUSE taking these medications    aspirin EC 81 MG tablet Wait to take this until your doctor or other care provider tells you to start again. Take 81 mg by mouth daily.   losartan  25 MG tablet Wait to take this until your doctor or other care provider tells you to start again. Commonly known as: COZAAR  TAKE 1 TABLET (25 MG TOTAL) BY MOUTH DAILY.       STOP taking these medications    ibuprofen  200 MG tablet  Commonly known as: ADVIL        TAKE these medications    docusate sodium  100 MG capsule Commonly known as: COLACE Take 1 capsule (100 mg total) by mouth daily as needed for mild constipation.   ferrous sulfate  325 (65 FE) MG EC tablet Take 1 tablet (325 mg total) by mouth every other day.   oxyCODONE  5 MG immediate release tablet Commonly known as: Oxy IR/ROXICODONE  Take 1 tablet (5 mg total) by mouth every 4 (four) hours as needed for moderate pain (pain score 4-6).               Discharge  Care Instructions  (From admission, onward)           Start     Ordered   11/17/23 0000  Discharge wound care:       Comments: As directed by surgery   11/17/23 1008            Follow-up Information     Maczis, Puja Gosai, PA-C. Go on 12/13/2023.   Specialty: General Surgery Why: 9:15 AM, please arrive 30 min prior to appointment time to check in. Contact information: [address] [facility] [place] [address] [phone]         Copland, Harlene BROCKS, MD. Schedule an appointment as soon as possible for a visit in 1 week(s).   Specialty: Family Medicine Contact information: [address] [place] [address] [phone]                 Discharge Instructions     Diet - low sodium heart healthy   Complete by: As directed    Discharge instructions   Complete by: As directed    **IMPORTANT DISCHARGE INSTRUCTIONS**   From Dr. Jonel: You were admitted for nausea and abdominal pain  Here we found that this was from cholecystitis (blocked, inflamed gallbladder) Stones from the gallbladder had drifted down into the ducts draining the gallbladder and caused it to be inflamed  You had a cholecystectomy (surgery to remove the gallbladder) by Dr. Fern  Then you had an ERCP (endoscopy to clear out the ducts) and all the stones were removed by Dr. Wilhelmenia  Follow up with Dr. Fern as directed (see below in to do section) Do not submerge incisions, do not lift more than 15 lbs You may return to work cautiously this week or next  No particular need to follow up with Dr. Melba office [facility]  Go see Dr. Watt in 1 week  Have her check your blood level (hemoglobin) and trend it if needed Take ferrous sulfate  325 mg every other day with Colace Iron is absorbed better with something acidic, so take with orange juice  For now, hold your aspirin and losartan  (aspirin can  contribute to bleeding, losartan  lowers blood pressure) I expect Dr. Watt can restart these in 1 week when you see her   Discharge wound care:   Complete by: As directed    As directed by surgery   Increase activity slowly   Complete by: As directed        Discharge Exam: Filed Weights   11/14/23 1059 11/14/23 1950 11/15/23 1235  Weight: 74.8 kg 74.4 kg 74.4 kg    General: Pt is alert, awake, not in acute distress Cardiovascular: RRR, nl S1-S2, no murmurs appreciated.   No LE edema.   Respiratory: Normal respiratory rate and rhythm.  CTAB without rales or  wheezes. Abdominal: Abdomen soft and mildly tender throughtout, nonfocal, bruising on abdomen.  No distension or HSM.   Neuro/Psych: Strength symmetric in upper and lower extremities.  Judgment and insight appear normal.   Condition at discharge: good  The results of significant diagnostics from this hospitalization (including imaging, microbiology, ancillary and laboratory) are listed below for reference.   Imaging Studies: DG CHEST PORT 1 VIEW Result Date: 11/14/2023 CLINICAL DATA:  Bilateral rales per EXAM: PORTABLE CHEST 1 VIEW COMPARISON:  11/16/2017 FINDINGS: The cardiomediastinal contours are normal. Subsegmental atelectasis at the left lung base. Pulmonary vasculature is normal. No consolidation, pleural effusion, or pneumothorax. No acute osseous abnormalities are seen. IMPRESSION: Subsegmental atelectasis at the left lung base. Electronically Signed   By: Andrea Gasman M.D.   On: 11/14/2023 18:20   MR BRAIN W WO CONTRAST Result Date: 11/13/2023 EXAM: MRI BRAIN WITH AND WITHOUT CONTRAST 11/13/2023 03:39:38 PM TECHNIQUE: Multiplanar multisequence MRI of the head/brain was performed with and without the administration of intravenous contrast. COMPARISON: MRI head without contrast 11/06/2015. CLINICAL HISTORY: Headache, new onset (Age >= 51y); intermittent severe pain left head x 4 months. FINDINGS: BRAIN AND VENTRICLES:  No acute infarct. No acute intracranial hemorrhage. No mass effect or midline shift. No hydrocephalus. The sella is unremarkable. Normal flow voids. No mass or abnormal enhancement. ORBITS: No acute abnormality. SINUSES: No acute abnormality. BONES AND SOFT TISSUES: Normal bone marrow signal and enhancement. No acute soft tissue abnormality. IMPRESSION: 1. No acute intracranial abnormality. 2. No mass or abnormal enhancement. Electronically signed by: Lonni Necessary MD 11/13/2023 04:30 PM EDT RP Workstation: [id number]   MR ABDOMEN MRCP W WO CONTAST Result Date: 11/13/2023 EXAM: MRCP WITH AND WITHOUT IV CONTRAST 11/12/2023 03:11:17 PM TECHNIQUE: Multisequence, multiplanar magnetic resonance images of the abdomen with and without intravenous contrast. MRCP sequences were performed. COMPARISON: Ultrasound 11/11/2023. CLINICAL HISTORY: Biliary obstruction suspected (Ped 0-17y); Probable common bile duct stones. FINDINGS: LIVER: No enhancing liver lesions identified. Diffuse periportal edema. GALLBLADDER AND BILIARY SYSTEM: Multiple gallstones are identified measuring around 6 mm, image 34/4. There is diffuse gallbladder wall enhancement and trace pericholecystic fluid. No significant gallbladder wall thickening. Dilatation of the cystic duct and common bile duct is noted with moderate intrahepatic bile duct dilatation. The common bile duct measures up to 1.2 cm. Numerous stones are identified throughout the common bile duct all measuring up to 8 mm. SPLEEN: Unremarkable. PANCREAS/PANCREATIC DUCT: Normal appearance of the pancreas without signs of main duct dilatation, inflammation or mass. ADRENAL GLANDS: Unremarkable. KIDNEYS: Unremarkable. LYMPH NODES: No enlarged abdominal lymph nodes. VASULATURE: Unremarkable. PERITONEUM: No ascites. ABDOMINAL WALL: No hernia. No mass. BOWEL: Grossly unremarkable. No bowel obstruction. BONES: No enhancing bone lesions. SOFT TISSUES: Unremarkable. MISCELLANEOUS:  Unremarkable. IMPRESSION: 1. Multiple gallstones and numerous stones throughout the common bile duct, with associated dilatation of the cystic duct and common bile duct (up to 1.2 cm) and moderate intrahepatic bile duct dilatation. 2. Diffuse gallbladder wall enhancement and trace pericholecystic fluid without significant gallbladder wall thickening. 3. Diffuse periportal edema. Electronically signed by: Waddell Calk MD 11/13/2023 05:12 AM EDT RP Workstation: [id number]   MR 3D Recon At Scanner Result Date: 11/13/2023 EXAM: MRCP WITH AND WITHOUT IV CONTRAST 11/12/2023 03:11:17 PM TECHNIQUE: Multisequence, multiplanar magnetic resonance images of the abdomen with and without intravenous contrast. MRCP sequences were performed. COMPARISON: Ultrasound 11/11/2023. CLINICAL HISTORY: Biliary obstruction suspected (Ped 0-17y); Probable common bile duct stones. FINDINGS: LIVER: No enhancing liver lesions identified. Diffuse periportal edema. GALLBLADDER AND BILIARY SYSTEM: Multiple gallstones  are identified measuring around 6 mm, image 34/4. There is diffuse gallbladder wall enhancement and trace pericholecystic fluid. No significant gallbladder wall thickening. Dilatation of the cystic duct and common bile duct is noted with moderate intrahepatic bile duct dilatation. The common bile duct measures up to 1.2 cm. Numerous stones are identified throughout the common bile duct all measuring up to 8 mm. SPLEEN: Unremarkable. PANCREAS/PANCREATIC DUCT: Normal appearance of the pancreas without signs of main duct dilatation, inflammation or mass. ADRENAL GLANDS: Unremarkable. KIDNEYS: Unremarkable. LYMPH NODES: No enlarged abdominal lymph nodes. VASULATURE: Unremarkable. PERITONEUM: No ascites. ABDOMINAL WALL: No hernia. No mass. BOWEL: Grossly unremarkable. No bowel obstruction. BONES: No enhancing bone lesions. SOFT TISSUES: Unremarkable. MISCELLANEOUS: Unremarkable. IMPRESSION: 1. Multiple gallstones and numerous stones  throughout the common bile duct, with associated dilatation of the cystic duct and common bile duct (up to 1.2 cm) and moderate intrahepatic bile duct dilatation. 2. Diffuse gallbladder wall enhancement and trace pericholecystic fluid without significant gallbladder wall thickening. 3. Diffuse periportal edema. Electronically signed by: Waddell Calk MD 11/13/2023 05:12 AM EDT RP Workstation: [id number]   US  Abdomen Limited RUQ (LIVER/GB) Result Date: 11/11/2023 EXAM: Right Upper Quadrant Abdominal Ultrasound TECHNIQUE: Real-time ultrasonography of the right upper quadrant of the abdomen was performed. COMPARISON: None. CLINICAL HISTORY: Pain. FINDINGS: LIVER: The liver demonstrates increased echogenicity. Intrahepatic biliary ductal dilatation is present. No discrete lesions are visualized. BILIARY SYSTEM: Gallbladder wall thickness is within normal limits at 1.4 mm. No evidence of pericholecystic fluid. No cholelithiasis. Negative sonographic Murphy's sign. Common bile duct is mildly distended measuring 8.2 mm. RIGHT KIDNEY: The right kidney is grossly unremarkable in appearances without evidence of hydronephrosis, echogenic calculi or worrisome mass lesions. PANCREAS: Visualized portions of the pancreas are unremarkable. OTHER: No right upper quadrant ascites. IMPRESSION: 1. Mildly distended common bile duct measuring 8.2 mm with intrahepatic biliary dilation. his suggests a distal obstruction. No obstructing lesion is visualized. 2. Hepatic steatosis. Electronically signed by: Lonni Necessary MD 11/11/2023 06:27 PM EDT RP Workstation: [id number]    Microbiology: Results for orders placed or performed during the hospital encounter of 11/11/23  MRSA Next Gen by PCR, Nasal     Status: None   Collection Time: 11/14/23  7:55 PM   Specimen: Nasal Mucosa; Nasal Swab  Result Value Ref Range Status   MRSA by PCR Next Gen NOT DETECTED NOT DETECTED Final    Comment: (NOTE) The GeneXpert MRSA Assay (FDA  approved for NASAL specimens only), is one component of a comprehensive MRSA colonization surveillance program. It is not intended to diagnose MRSA infection nor to guide or monitor treatment for MRSA infections. Test performance is not FDA approved in patients less than 26 years old. Performed at [facility], [address], [place], [place] [address]     Labs: CBC: Recent Labs  Lab 11/11/23 1750 11/12/23 0414 11/13/23 0521 11/14/23 0313 11/14/23 2112 11/15/23 0549 11/16/23 0603 11/17/23 0525  WBC 10.7*   < > 5.0 6.5 11.2* 11.7* 8.1 5.1  NEUTROABS 9.8*  --  3.6  --   --   --   --   --   HGB 14.6   < > 14.6 14.0 11.8* 9.9* 8.2* 8.0*  HCT 43.5   < > 42.8 41.3 35.3* 29.9* 24.5* 24.3*  MCV 91.0   < > 91.5 92.2 92.9 94.3 93.9 95.3  PLT 163   < > 145* 151 198 181 133* 131*   < > = values in this interval not displayed.  Basic Metabolic Panel: Recent Labs  Lab 11/12/23 0414 11/13/23 0521 11/14/23 0313 11/15/23 0549 11/16/23 0603 11/17/23 0525  NA 137 138 138 133* 136 137  K 3.2* 3.5 3.3* 3.9 4.1 3.7  CL 104 107 104 101 106 101  CO2 22 22 23 23 23 27   GLUCOSE 114* 94 116* 140* 108* 90  BUN 7 5* 10 20 15 15   CREATININE 0.69 0.79 0.87 1.49* 0.93 0.71  CALCIUM 8.5* 8.9 8.8* 7.6* 8.1* 8.4*  MG 1.8  --   --   --   --   --    Liver Function Tests: Recent Labs  Lab 11/13/23 0521 11/14/23 0313 11/15/23 0549 11/16/23 0603 11/17/23 0525  AST 150* 96* 57* 201* 114*  ALT 264* 200* 131* 149* 140*  ALKPHOS 217* 205* 140* 258* 225*  BILITOT 3.0* 1.8* 0.9 2.5* 1.1  PROT 6.7 6.2* 5.4* 5.3* 5.3*  ALBUMIN 3.6 3.4* 3.0* 2.9* 3.0*   CBG: No results for input(s): GLUCAP in the last 168 hours.  Discharge time spent: approximately 45 minutes spent on discharge counseling, evaluation of patient on day of discharge, and coordination of discharge planning with nursing, social work, pharmacy and case management  Signed: Lonni SHAUNNA Dalton, MD [facility] 11/17/2023

## 2023-11-17 NOTE — Progress Notes (Signed)
 Discharge instructions (including medications) discussed with and copy provided to patient, she verbalized understanding. Patient PIV x2 removed and she is dressed. TOC medication pick up at discharge.

## 2023-11-17 NOTE — Progress Notes (Signed)
 Progress Note: General Surgery Service   Chief Complaint/Subjective:   Objective: Vital signs in last 24 hours: Temp:  [97.9 F (36.6 C)-98.2 F (36.8 C)] 97.9 F (36.6 C) (09/18 0822) Pulse Rate:  [72-92] 72 (09/18 0822) Resp:  [16-24] 18 (09/18 0822) BP: (103-140)/(44-83) 140/83 (09/18 0822) SpO2:  [96 %-100 %] 98 % (09/18 0822) Last BM Date : 11/16/23  Intake/Output from previous day: 09/17 0701 - 09/18 0700 In: 30.7 [IV Piggyback:30.7] Out: -  Intake/Output this shift: No intake/output data recorded.  Constitutional: NAD; conversant; no deformities Eyes: Moist conjunctiva; no lid lag; anicteric; PERRL Neck: Trachea midline; no thyromegaly Lungs: Normal respiratory effort; no tactile fremitus CV: RRR; no palpable thrills; no pitting edema GI: Abd soft, incisions c/d/I w/ glue and some bruising from the local with epinephrine  MSK: Normal range of motion of extremities; no clubbing/cyanosis Psychiatric: Appropriate affect; alert and oriented x3 Lymphatic: No palpable cervical or axillary lymphadenopathy  Lab Results: CBC  Recent Labs    11/16/23 0603 11/17/23 0525  WBC 8.1 5.1  HGB 8.2* 8.0*  HCT 24.5* 24.3*  PLT 133* 131*   BMET Recent Labs    11/16/23 0603 11/17/23 0525  NA 136 137  K 4.1 3.7  CL 106 101  CO2 23 27  GLUCOSE 108* 90  BUN 15 15  CREATININE 0.93 0.71  CALCIUM 8.1* 8.4*   PT/INR No results for input(s): LABPROT, INR in the last 72 hours.  ABG No results for input(s): PHART, HCO3 in the last 72 hours.  Invalid input(s): PCO2, PO2  Anti-infectives: Anti-infectives (From admission, onward)    Start     Dose/Rate Route Frequency Ordered Stop   11/15/23 1015  piperacillin -tazobactam (ZOSYN ) IVPB 3.375 g        3.375 g 12.5 mL/hr over 240 Minutes Intravenous Every 8 hours 11/15/23 0928 11/16/23 1100   11/12/23 0200  piperacillin -tazobactam (ZOSYN ) IVPB 3.375 g  Status:  Discontinued        3.375 g 12.5 mL/hr over 240  Minutes Intravenous Every 8 hours 11/12/23 0131 11/15/23 0928   11/11/23 1900  piperacillin -tazobactam (ZOSYN ) IVPB 3.375 g        3.375 g 100 mL/hr over 30 Minutes Intravenous  Once 11/11/23 1852 11/11/23 2016       Medications: Scheduled Meds:  Chlorhexidine  Gluconate Cloth  6 each Topical Daily   docusate sodium   100 mg Oral BID   indomethacin   100 mg Rectal Once   pantoprazole   40 mg Oral Daily   Continuous Infusions:   PRN Meds:.acetaminophen , hydrALAZINE , ondansetron  (ZOFRAN ) IV, oxyCODONE , polyethylene glycol  Assessment/Plan: Ruth Cooper is an 53 y.o. female with choledocholithiasis.  She underwent laparoscopic cholecystectomy on 11/14/23.  Postoperative hypotension and stridor required transfer to the ICU.  ERCP completed 11/15/23 with sphincterotomy and duct cleared.  Gastritis noted on EGD with h. Pylori pending.  Acute blood loss anemia combined with dilutional anemia, appears stable this morning   Pain control Increase activity Gallbladder shows chronic cholecystitis with cholelithiasis on pathology EGD pathology with gastritis, H. pylori pending LFTs elevation resolving this morning   I think from a surgery standpoint she looks ready for discharge home.    LOS: 6 days      Deward JINNY Foy, MD  [facility] Use [email or web address] to contact on call provider

## 2023-11-20 NOTE — Patient Instructions (Incomplete)
 It was good to see you today, I am sorry that you had this illness!  I will be in touch with your labs asap Continue to treat you pain as you are; but let me know if you ae not making progress Ok to start back on a 1/2 tablet of your BP medication

## 2023-11-20 NOTE — Progress Notes (Addendum)
 [facility], [employer] at [employer] [address], [address] [place], [place] [address] patient seen today for follow-up from recent hospitalization with acute cholecystitis [phone] Fax [phone]  Date:  11/21/2023   Name:  Ruth Cooper   DOB:  [birth year]   MRN:  [id number]  PCP:  Watt Harlene BROCKS, MD    Chief Complaint: Hospitalization Follow-up (Pt states she is still in pain after being told she should not be by this point /Extreme fatigue )   History of Present Illness:  Ruth Cooper is a 53 y.o. very pleasant female patient who presents with the following:  Patient seen today for follow-up from recent hospital admission for acute choledocholithiasis I saw her most recently for routine visit in July She was already under quite a bit of stress because her husband Chyrl had a major stroke over the summer  She was admitted 9/12 through 11/17/2023 Recommendations at discharge:  Follow up with [facility] for cholecystectomy as directed Follow up with PCP Dr. Watt in 1 week Dr. Watt: Please obtain CBC and CMP in 1 week (discharge Cr 0.7, AST/ALT 114/140, and Hgb 8.0 g/dL) Trend Hgb after that as appropriate Please resume losartan  when appropriate Discharge Diagnoses: Principal Problem:   Cholecystitis due to choledocholithiasis Active Problems:   Acute blood loss anemia, likely multifactorial   HTN (hypertension)   Sepsis ruled out  Hospital Course: 53 y.o. F with HTN, hx vertebral artery dissection who presented with abdominal pain and found to have  choledocholithiasis. GI consulted. MRCP confirmed choledocholithiasis with gallbladder wall thickening. General surgery consulted for cholecystectomy. Choledocholithiasis Acute cholecystitis S/p lap chole 9/15 and ERCP 9/16 Patient admitted and GI consulted given CT findings on admission.   MRCP confirmed choledocholithiasis and General Surgery were  consulted. Post-operatively, she had hypotension and stridor and was transferred to ICU.  Underwent ERCP at that point that showed many small stones in the CBD, cleared with sphincterotomy and balloon.   Post-ERCP, had small LFT bump, which was improving at the time of discharge.  BP improved, transferred [facility].  Hgb there remained stable, ambulating and tolerating diet, discharged to home with PCP follow up.   Acute anemia Hgb dropped 14 to 8 from prior to surgery to after ERCP.  Likely some intra-op bleeding, some intra-ERCP bleeding, some dilution and phlebotomy.  In last 24 hours, has stabilized.  Discussed with Gen Surg, based on available information, high confidence she is not actively bleeding.  She is asymptomatic, balance of benefit and harm favors conservative management, outpatient follow up without transfusion at this time. Postintubation stridor Resolved spontaneously Hypertension BP soft.  Losartan  held at discharge. Hypokalemia Supplemented and resolved  Can give flu shot - she will do with her daughter   Discussed the use of AI scribe software for clinical note transcription with the patient, who gave verbal consent to proceed.  History of Present Illness Ruth Cooper is a 53 year old female who presents with post-cholecystectomy pain and fatigue.  She recently underwent a cholecystectomy due to gallstones and a gallbladder infection. Three years ago, she experienced abdominal pain and was advised to have her gallbladder removed electively, but the pain subsided after an ultrasound, and she did not pursue surgery at that time. The pain returned three weeks ago, leading to the recent surgery. Postoperatively, she also had stones in the bile ducts, requiring an MRCP procedure.  Currently, she is experiencing significant fatigue and pain, particularly  when breathing deeply or moving from a lying to a sitting position. The pain is located at the site of her surgery  and worsens with movement. She is taking Tylenol  every six hours during the day and oxycodone  every four hours at night for pain management. Despite this, she has difficulty sleeping and is currently sleeping on the couch to avoid movement from her partner.  She has been attempting to walk daily, covering three-quarters to one and a half miles, but finds herself moving slowly and feeling more fatigued than expected. After her previous neck surgery, she was able to walk ten miles within days, but attributes her current fatigue to a drop in hemoglobin levels and anemia. She has returned to work but finds it challenging due to her fatigue and pain.  She reports changes in her bowel movements, describing them as dark green. No issues with bowel movements or vomiting. She is not experiencing any problems with eating, although she is not very hungry. She recalls a significant drop in blood pressure and difficulty breathing after her first procedure, which led to a stay in the hospital's intensive care unit.  Her family history includes her mother having had a cholecystectomy, although her mother's experience was less severe. Her middle child, who lives at home and has anxiety issues, was able to assist her partner while she was hospitalized.  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    Past Surgical History:  Procedure Laterality Date   BACK SURGERY  12/08/2017   cervical disc    CERVICAL DISC ARTHROPLASTY  2020   CHOLECYSTECTOMY N/A 11/14/2023   Procedure: LAPAROSCOPIC CHOLECYSTECTOMY;  Surgeon: Lyndel Deward PARAS, MD;  Location: [facility] OR;  Service: General;  Laterality: N/A;   ENDOMETRIAL ABLATION  2020   ERCP N/A 11/15/2023   Procedure: ERCP, WITH INTERVENTION IF INDICATED;  Surgeon: Wilhelmenia Aloha Raddle., MD;  Location: [facility] ENDOSCOPY;  Service: Gastroenterology;  Laterality: N/A;   TONSILLECTOMY  1997   WISDOM TOOTH EXTRACTION      Social History   Tobacco Use   Smoking status: Never   Smokeless tobacco: Never  Vaping Use   Vaping status: Never Used  Substance Use Topics   Alcohol use: No   Drug use: No    Family History  Problem Relation Age of Onset   Colon cancer Mother        Living   Breast cancer Mother 16       again @ 5 - BrCA +   Healthy Father        Living   Healthy Daughter        x1   Breast cancer Maternal Grandmother 51   Colon cancer Maternal Grandfather  Alzheimer's disease Paternal Grandmother    Heart disease Paternal Grandfather    Hypertension Paternal Grandfather    Healthy Brother        x1   Healthy Son        x2   Allergies Son    Esophageal cancer Neg Hx    Stomach cancer Neg Hx    Rectal cancer Neg Hx     Allergies  Allergen Reactions   Trazodone  Other (See Comments)    Excessive drowsiness Confusion Dizziness     Medication list has been reviewed and updated.  Current Outpatient Medications on File Prior to Visit  Medication Sig Dispense Refill   [Paused] aspirin EC 81 MG tablet Take 81 mg by mouth daily.     docusate sodium  (COLACE) 100 MG capsule Take 1 capsule (100 mg total) by mouth daily as needed for mild constipation.     ferrous sulfate  325 (65 FE)  MG EC tablet Take 1 tablet (325 mg total) by mouth every other day.     [Paused] losartan  (COZAAR ) 25 MG tablet TAKE 1 TABLET (25 MG TOTAL) BY MOUTH DAILY. 90 tablet 1   oxyCODONE  (OXY IR/ROXICODONE ) 5 MG immediate release tablet Take 1 tablet (5 mg total) by mouth every 4 (four) hours as needed for moderate pain (pain score 4-6). 15 tablet 0   No current facility-administered medications on file prior to visit.    Review of Systems:  As per HPI- otherwise negative.   Physical Examination: Vitals:   11/21/23 1459  BP: 138/86  Pulse: 97  Temp: 98.4 F (36.9 C)  SpO2: 100%   Vitals:   11/21/23 1459  Weight: 159 lb 3.2 oz (72.2 kg)  Height: 5' 6 (1.676 m)   Body mass index is 25.7 kg/m. Ideal Body Weight: Weight in (lb) to have BMI = 25: 154.6  GEN: no acute distress.  Normal weight, looks well HEENT: Atraumatic, Normocephalic.  Ears and Nose: No external deformity. CV: RRR, No M/G/R. No JVD. No thrill. No extra heart sounds. PULM: CTA B, no wheezes, crackles, rhonchi. No retractions. No resp. distress. No accessory muscle use. ABD: S, NT, ND, +BS. No rebound. No HSM. EXTR: No c/c/e PSYCH: Normally interactive. Conversant.  Bruising from lab chole sites - they are tender   Assessment and Plan: Essential hypertension - Plan: CBC, Comprehensive metabolic panel with GFR  Choledocholithiasis  Assessment & Plan Postoperative pain and fatigue following cholecystectomy for choledocholithiasis Pain at surgical site exacerbated by movement and deep breathing. Fatigue likely due to surgery and anemia. Reports dark green stools and dietary changes post-surgery. - Administer Tylenol  every six hours during the day. - Administer oxycodone  at night for pain management. - Consider half an oxycodone  during the day if pain is not controlled. - Encourage walking as tolerated, avoiding exhaustion. - Perform deep breathing exercises to prevent pneumonia. - Advise against lifting more  than 15 pounds. - Follow a lighter diet with less fats to aid digestion.  Anemia following recent surgery Anemia likely secondary to recent surgery with fatigue and dizziness. Significant drop in hemoglobin levels post-surgery. - Order blood work to assess hemoglobin levels. - Advise rest and gradual return to activity as tolerated.  Primary hypertension Blood pressure well-managed post-surgery. Monitor for dizziness due to recent anemia and fatigue. - Resume losartan  at 25 mg daily if tolerated. - Consider starting with half a tablet (12.5 mg) if dizziness occurs.  Signed Harlene Schroeder, MD  Addendum 9/23, received labs as below.  Message to patient Results for orders placed or performed in visit on 11/21/23  CBC   Collection Time: 11/21/23  3:50 PM  Result Value Ref Range   WBC 9.1 4.0 - 10.5 K/uL   RBC 3.70 (L) 3.87 - 5.11 Mil/uL   Platelets 284.0 150.0 - 400.0 K/uL   Hemoglobin 11.7 (L) 12.0 - 15.0 g/dL   HCT 65.2 (L) 63.9 - 53.9 %   MCV 93.8 78.0 - 100.0 fl   MCHC 33.6 30.0 - 36.0 g/dL   RDW 85.5 88.4 - 84.4 %  Comprehensive metabolic panel with GFR   Collection Time: 11/21/23  3:50 PM  Result Value Ref Range   Sodium 137 135 - 145 mEq/L   Potassium 4.7 3.5 - 5.1 mEq/L   Chloride 102 96 - 112 mEq/L   CO2 30 19 - 32 mEq/L   Glucose, Bld 101 (H) 70 - 99 mg/dL   BUN 24 (H) 6 - 23 mg/dL   Creatinine, Ser 9.03 0.40 - 1.20 mg/dL   Total Bilirubin 1.8 (H) 0.2 - 1.2 mg/dL   Alkaline Phosphatase 189 (H) 39 - 117 U/L   AST 44 (H) 0 - 37 U/L   ALT 62 (H) 0 - 35 U/L   Total Protein 6.8 6.0 - 8.3 g/dL   Albumin 4.4 3.5 - 5.2 g/dL   GFR 32.29 >39.99 mL/min   Calcium 9.5 8.4 - 10.5 mg/dL  A87   Collection Time: 11/21/23  3:50 PM  Result Value Ref Range   Vitamin B-12 652 211 - 911 pg/mL    "

## 2023-11-21 ENCOUNTER — Encounter: Payer: Self-pay | Admitting: Family Medicine

## 2023-11-21 ENCOUNTER — Ambulatory Visit (INDEPENDENT_AMBULATORY_CARE_PROVIDER_SITE_OTHER): Admitting: Family Medicine

## 2023-11-21 VITALS — BP 138/86 | HR 97 | Temp 98.4°F | Ht 66.0 in | Wt 159.2 lb

## 2023-11-21 DIAGNOSIS — K8050 Calculus of bile duct without cholangitis or cholecystitis without obstruction: Secondary | ICD-10-CM | POA: Diagnosis not present

## 2023-11-21 DIAGNOSIS — I10 Essential (primary) hypertension: Secondary | ICD-10-CM

## 2023-11-21 DIAGNOSIS — R5383 Other fatigue: Secondary | ICD-10-CM

## 2023-11-22 ENCOUNTER — Other Ambulatory Visit: Payer: Self-pay

## 2023-11-22 ENCOUNTER — Emergency Department (HOSPITAL_COMMUNITY)
Admission: EM | Admit: 2023-11-22 | Discharge: 2023-11-23 | Discharge status: Against Medical Advice | Attending: Emergency Medicine | Admitting: Emergency Medicine

## 2023-11-22 ENCOUNTER — Encounter: Payer: Self-pay | Admitting: Family Medicine

## 2023-11-22 ENCOUNTER — Encounter (HOSPITAL_COMMUNITY): Payer: Self-pay

## 2023-11-22 DIAGNOSIS — K8050 Calculus of bile duct without cholangitis or cholecystitis without obstruction: Secondary | ICD-10-CM

## 2023-11-22 DIAGNOSIS — R1013 Epigastric pain: Secondary | ICD-10-CM | POA: Insufficient documentation

## 2023-11-22 DIAGNOSIS — R1011 Right upper quadrant pain: Secondary | ICD-10-CM | POA: Diagnosis present

## 2023-11-22 DIAGNOSIS — Z79899 Other long term (current) drug therapy: Secondary | ICD-10-CM | POA: Diagnosis not present

## 2023-11-22 DIAGNOSIS — Z5321 Procedure and treatment not carried out due to patient leaving prior to being seen by health care provider: Secondary | ICD-10-CM | POA: Insufficient documentation

## 2023-11-22 DIAGNOSIS — Z7982 Long term (current) use of aspirin: Secondary | ICD-10-CM | POA: Diagnosis not present

## 2023-11-22 DIAGNOSIS — I10 Essential (primary) hypertension: Secondary | ICD-10-CM | POA: Diagnosis not present

## 2023-11-22 LAB — COMPREHENSIVE METABOLIC PANEL WITH GFR
ALT: 62 U/L — ABNORMAL HIGH (ref 0–35)
AST: 44 U/L — ABNORMAL HIGH (ref 0–37)
Albumin: 4.4 g/dL (ref 3.5–5.2)
Alkaline Phosphatase: 189 U/L — ABNORMAL HIGH (ref 39–117)
BUN: 24 mg/dL — ABNORMAL HIGH (ref 6–23)
CO2: 30 meq/L (ref 19–32)
Calcium: 9.5 mg/dL (ref 8.4–10.5)
Chloride: 102 meq/L (ref 96–112)
Creatinine, Ser: 0.96 mg/dL (ref 0.40–1.20)
GFR: 67.7 mL/min (ref >60.00)
Glucose, Bld: 101 mg/dL — ABNORMAL HIGH (ref 70–99)
Potassium: 4.7 meq/L (ref 3.5–5.1)
Sodium: 137 meq/L (ref 135–145)
Total Bilirubin: 1.8 mg/dL — ABNORMAL HIGH (ref 0.2–1.2)
Total Protein: 6.8 g/dL (ref 6.0–8.3)

## 2023-11-22 LAB — CBC WITH DIFFERENTIAL/PLATELET
Abs Immature Granulocytes: 0.06 K/uL (ref 0.00–0.07)
Basophils Absolute: 0.1 K/uL (ref 0.0–0.1)
Basophils Relative: 1 %
Eosinophils Absolute: 0.2 K/uL (ref 0.0–0.5)
Eosinophils Relative: 2 %
HCT: 34 % — ABNORMAL LOW (ref 36.0–46.0)
Hemoglobin: 11.2 g/dL — ABNORMAL LOW (ref 12.0–15.0)
Immature Granulocytes: 1 %
Lymphocytes Relative: 4 %
Lymphs Abs: 0.4 K/uL — ABNORMAL LOW (ref 0.7–4.0)
MCH: 31.6 pg (ref 26.0–34.0)
MCHC: 32.9 g/dL (ref 30.0–36.0)
MCV: 96 fL (ref 80.0–100.0)
Monocytes Absolute: 0.7 K/uL (ref 0.1–1.0)
Monocytes Relative: 7 %
Neutro Abs: 8.7 K/uL — ABNORMAL HIGH (ref 1.7–7.7)
Neutrophils Relative %: 85 %
Platelets: 320 K/uL (ref 150–400)
RBC: 3.54 MIL/uL — ABNORMAL LOW (ref 3.87–5.11)
RDW: 14.2 % (ref 11.5–15.5)
WBC: 10.1 K/uL (ref 4.0–10.5)
nRBC: 0 % (ref 0.0–0.2)

## 2023-11-22 LAB — CBC
HCT: 34.7 % — ABNORMAL LOW (ref 36.0–46.0)
Hemoglobin: 11.7 g/dL — ABNORMAL LOW (ref 12.0–15.0)
MCHC: 33.6 g/dL (ref 30.0–36.0)
MCV: 93.8 fl (ref 78.0–100.0)
Platelets: 284 K/uL (ref 150.0–400.0)
RBC: 3.7 Mil/uL — ABNORMAL LOW (ref 3.87–5.11)
RDW: 14.4 % (ref 11.5–15.5)
WBC: 9.1 K/uL (ref 4.0–10.5)

## 2023-11-22 LAB — VITAMIN B12: Vitamin B-12: 652 pg/mL (ref 211–911)

## 2023-11-22 NOTE — ED Provider Triage Note (Signed)
 Emergency Medicine Provider Triage Evaluation Note  Adrienna Karis , a 53 y.o. female  was evaluated in triage.  Pt complains of recent gallbladder removal and stone removal, RUQ and epigastric pain. Intermittent in nature.  Review of Systems  Positive: Intermittent abd pain, Negative: N/V, fever, chills, SHOB, CP  Physical Exam  LMP 11/16/2023 (Exact Date)  Gen:   Awake, no distress   Resp:  Normal effort  MSK:   Moves extremities without difficulty  Other:    Medical Decision Making  Medically screening exam initiated at 11:32 PM.  Appropriate orders placed.  Jennah Satchell was informed that the remainder of the evaluation will be completed by another provider, this initial triage assessment does not replace that evaluation, and the importance of remaining in the ED until their evaluation is complete.  Labs ordered   Francis Ileana SAILOR, [place] 11/22/23 2336

## 2023-11-22 NOTE — ED Triage Notes (Signed)
 Arrives [facility] from home with intermittent severe upper abdominal pain.   Recent gallbladder removed here last week.

## 2023-11-23 ENCOUNTER — Encounter (HOSPITAL_BASED_OUTPATIENT_CLINIC_OR_DEPARTMENT_OTHER): Payer: Self-pay | Admitting: Emergency Medicine

## 2023-11-23 ENCOUNTER — Other Ambulatory Visit: Payer: Self-pay

## 2023-11-23 ENCOUNTER — Emergency Department (HOSPITAL_BASED_OUTPATIENT_CLINIC_OR_DEPARTMENT_OTHER)
Admission: EM | Admit: 2023-11-23 | Discharge: 2023-11-23 | Disposition: A | Discharge status: Home / Self Care | Source: Home / Self Care | Attending: Emergency Medicine | Admitting: Emergency Medicine

## 2023-11-23 ENCOUNTER — Emergency Department (HOSPITAL_BASED_OUTPATIENT_CLINIC_OR_DEPARTMENT_OTHER)

## 2023-11-23 DIAGNOSIS — R1013 Epigastric pain: Secondary | ICD-10-CM | POA: Insufficient documentation

## 2023-11-23 DIAGNOSIS — I10 Essential (primary) hypertension: Secondary | ICD-10-CM | POA: Insufficient documentation

## 2023-11-23 DIAGNOSIS — Z79899 Other long term (current) drug therapy: Secondary | ICD-10-CM | POA: Insufficient documentation

## 2023-11-23 DIAGNOSIS — Z7982 Long term (current) use of aspirin: Secondary | ICD-10-CM | POA: Insufficient documentation

## 2023-11-23 DIAGNOSIS — Z9049 Acquired absence of other specified parts of digestive tract: Secondary | ICD-10-CM

## 2023-11-23 LAB — URINALYSIS, ROUTINE W REFLEX MICROSCOPIC
Bilirubin Urine: NEGATIVE
Glucose, UA: NEGATIVE mg/dL
Ketones, ur: NEGATIVE mg/dL
Nitrite: NEGATIVE
Protein, ur: 30 mg/dL — AB
Specific Gravity, Urine: 1.029 (ref 1.005–1.030)
pH: 5 (ref 5.0–8.0)

## 2023-11-23 LAB — COMPREHENSIVE METABOLIC PANEL WITH GFR
ALT: 86 U/L — ABNORMAL HIGH (ref 0–44)
AST: 200 U/L — ABNORMAL HIGH (ref 15–41)
Albumin: 3.6 g/dL (ref 3.5–5.0)
Alkaline Phosphatase: 293 U/L — ABNORMAL HIGH (ref 38–126)
Anion gap: 12 (ref 5–15)
BUN: 20 mg/dL (ref 6–20)
CO2: 19 mmol/L — ABNORMAL LOW (ref 22–32)
Calcium: 8.8 mg/dL — ABNORMAL LOW (ref 8.9–10.3)
Chloride: 105 mmol/L (ref 98–111)
Creatinine, Ser: 0.73 mg/dL (ref 0.44–1.00)
GFR, Estimated: >60 mL/min (ref >60)
Glucose, Bld: 144 mg/dL — ABNORMAL HIGH (ref 70–99)
Potassium: 4 mmol/L (ref 3.5–5.1)
Sodium: 136 mmol/L (ref 135–145)
Total Bilirubin: 2.3 mg/dL — ABNORMAL HIGH (ref 0.0–1.2)
Total Protein: 6.6 g/dL (ref 6.5–8.1)

## 2023-11-23 LAB — PREGNANCY, URINE: Preg Test, Ur: NEGATIVE

## 2023-11-23 LAB — LIPASE, BLOOD: Lipase: 53 U/L — ABNORMAL HIGH (ref 11–51)

## 2023-11-23 MED ORDER — IOHEXOL 300 MG/ML  SOLN
100.0000 mL | Freq: Once | INTRAMUSCULAR | Status: AC | PRN
  Administered 2023-11-23: 85 mL via INTRAVENOUS

## 2023-11-23 NOTE — ED Provider Notes (Signed)
 [place] EMERGENCY DEPARTMENT AT [facility] Provider Note   CSN: [id number] Arrival date & time: 11/23/23  9981     Patient presents with: Abdominal Pain   Ruth Cooper is a 53 y.o. female.   Patient is a 53 year old female with history of hypertension, GERD, and recent cholecystectomy for choledocholithiasis and acute cholecystitis.  She also underwent ERCP with removal of several small stones.  This was performed 1 week ago.  Patient presenting today with complaints of epigastric pain.  This began earlier this evening.  She describes severe pain to the epigastric area with no nausea or vomiting.  She denies any fevers or chills.  She initially went to [facility], but left due to prolonged wait times.       Prior to Admission medications   Medication Sig Start Date End Date Taking? Authorizing Provider  aspirin EC 81 MG tablet Take 81 mg by mouth daily.    [provider]  docusate sodium  (COLACE) 100 MG capsule Take 1 capsule (100 mg total) by mouth daily as needed for mild constipation. 11/17/23   Danford, Lonni SQUIBB, MD  ferrous sulfate  325 (65 FE) MG EC tablet Take 1 tablet (325 mg total) by mouth every other day. 11/17/23 11/16/24  Danford, Lonni SQUIBB, MD  losartan  (COZAAR ) 25 MG tablet TAKE 1 TABLET (25 MG TOTAL) BY MOUTH DAILY. 10/24/23   Copland, Harlene BROCKS, MD  oxyCODONE  (OXY IR/ROXICODONE ) 5 MG immediate release tablet Take 1 tablet (5 mg total) by mouth every 4 (four) hours as needed for moderate pain (pain score 4-6). 11/17/23   Danford, Lonni SQUIBB, MD    Allergies: Trazodone     Review of Systems  All other systems reviewed and are negative.   Updated Vital Signs BP (!) 162/81   Pulse 90   Temp 98 F (36.7 C) (Oral)   Resp 20   Ht 5' 6 (1.676 m)   Wt 72.1 kg   LMP 11/16/2023 (Exact Date)   SpO2 96%   BMI 25.66 kg/m   Physical Exam Vitals and nursing note reviewed.  Constitutional:      General: She is not in acute  distress.    Appearance: She is well-developed. She is not diaphoretic.  HENT:     Head: Normocephalic and atraumatic.  Cardiovascular:     Rate and Rhythm: Normal rate and regular rhythm.     Heart sounds: No murmur heard.    No friction rub. No gallop.  Pulmonary:     Effort: Pulmonary effort is normal. No respiratory distress.     Breath sounds: Normal breath sounds. No wheezing.  Abdominal:     General: Bowel sounds are normal. There is no distension.     Palpations: Abdomen is soft.     Tenderness: There is abdominal tenderness in the epigastric area. There is no right CVA tenderness, left CVA tenderness, guarding or rebound.     Comments: The incision sites for the laparoscopic cholecystectomy continue to have Dermabond in place and are well-appearing.  There is no redness or purulent drainage.  Musculoskeletal:        General: Normal range of motion.     Cervical back: Normal range of motion and neck supple.  Skin:    General: Skin is warm and dry.  Neurological:     General: No focal deficit present.     Mental Status: She is alert and oriented to person, place, and time.     (all labs ordered are  listed, but only abnormal results are displayed) Labs Reviewed - No data to display  EKG: None  Radiology: No results found.   Procedures   Medications Ordered in the ED - No data to display                                  Medical Decision Making Amount and/or Complexity of Data Reviewed Radiology: ordered.  Risk Prescription drug management.   Patient is a 53 year old female status post cholecystectomy and ERCP to remove residual stones last week.  Patient presenting today with upper abdominal pain.  Patient arrives here with stable vital signs and is afebrile.  Physical examination reveals tenderness in the area of the laparoscopic incisions, but exam is otherwise unremarkable.  Laboratory studies obtained when she initially presented to [facility].  These have  been reviewed and show no leukocytosis or significant electrolyte derangement.  It does show mild elevations of her transaminases, alk phos and total bilirubin, slightly up from yesterday.  CT scan of the abdomen and pelvis obtained showing loculated high density fluid within the gallbladder fossa most likely related to residual hemoperitoneum.    Patient's pain resolved prior to my exam/evaluation.  She feels fine at this time.  She is afebrile, nontoxic-appearing, and has no white count.  I doubt peritonitis or infectious complication.  She does have a mild elevation of her LFTs, the significance of which I am uncertain.  At this point, nothing appears emergent and I feel as though she can safely be discharged.  I will have her follow-up with primary doctor for repeat laboratory studies next week and return in the meantime if symptoms worsen or change.     Final diagnoses:  None    ED Discharge Orders     None          Geroldine Berg, MD 11/23/23 [phone]

## 2023-11-23 NOTE — ED Triage Notes (Signed)
 Patient presents with severe epigastric pain this week that worsened tonight. Recent gallbladder and stone removal last week.

## 2023-11-23 NOTE — ED Notes (Signed)
 Pt states she cannot wait any longer to be seen, wishes to go to another ED. Left with parents

## 2023-11-23 NOTE — Discharge Instructions (Signed)
 Continue medications as previously prescribed.  Follow-up with your primary doctor either later this week or early next week for a repeat of your laboratory studies.  Return to the emergency department in the meantime if you develop worsening pain, high fevers, or for other new and concerning symptoms.

## 2023-11-27 ENCOUNTER — Encounter: Payer: Self-pay | Admitting: Family Medicine

## 2023-11-27 NOTE — Progress Notes (Signed)
 [facility] at [facility] [address], [address] [place], [place] [address] [phone]  Date:  11/30/2023   Name:  Ruth Cooper   DOB:  [birth year]   MRN:  [id number]  PCP:  Watt Harlene BROCKS, MD    Chief Complaint: No chief complaint on file.   History of Present Illness:  Ruth Cooper is a 53 y.o. very pleasant female patient who presents with the following:  Patient seen today for follow-up from recent episode of choledocholithiasis I saw her most recently on 9/22 following her admission 9/12 through 9/18 She had a lap chole 9/15 and then an ERCP to remove retained stones 9/16 She also dropped her hemoglobin dramatically after the ERCP  Following her visit she went to the ER twice, on 9/23 she left prior to being seen 9/24 she was seen at [facility]-the get CT scan which showed some fluid in the gallbladder fossa.  Also, her LFTs which had been trending down bumped up again-AST 200, ALT 86, alk phos 293 Her hemoglobin has remained relatively stable and improved, 11.2  CT dated 9/24 IMPRESSION: 1. Lobulated high-density fluid within the gallbladder fossa and moderate uniformly high-density fluid within the pelvis most in keeping with mild hemoperitoneum. A biliary leak is considered less likely given the density of the fluid but is not completely excluded. Hepatobiliary scintigraphy may be helpful for further evaluation. 2. Probable secondary peritonitis. 3. Right lateral abdominal wall subcutaneous infiltration, possibly postsurgical. No subcutaneous hematoma identified.  Discussed the use of AI scribe software for clinical note transcription with the patient, who gave verbal consent to proceed.  History of Present Illness    Patient Active Problem List   Diagnosis Date Noted   LFT elevation 11/15/2023   Common bile duct dilation 11/15/2023   Erosive gastritis 11/15/2023   GERD (gastroesophageal reflux  disease) 11/12/2023   Choledocholithiasis 11/11/2023   COVID-19 virus infection 09/13/2023   Ascending aorta dilatation 04/15/2023   HTN (hypertension) 03/24/2023   Frequent headaches 03/24/2023   Benign microscopic hematuria 11/05/2015   Migraine without aura and without status migrainosus, not intractable 10/31/2015   Memory loss 10/31/2015   Knee pain, right 06/03/2013    Past Medical History:  Diagnosis Date   Frequent headaches    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    Past Surgical History:  Procedure Laterality Date   BACK SURGERY  12/08/2017   cervical disc    CERVICAL DISC ARTHROPLASTY  2020   CHOLECYSTECTOMY N/A 11/14/2023   Procedure: LAPAROSCOPIC CHOLECYSTECTOMY;  Surgeon: Lyndel Deward PARAS, MD;  Location: [facility] OR;  Service: General;  Laterality: N/A;   ENDOMETRIAL ABLATION  2020   ERCP N/A 11/15/2023   Procedure: ERCP, WITH INTERVENTION IF INDICATED;  Surgeon: Wilhelmenia Aloha Raddle., MD;  Location: [facility] ENDOSCOPY;  Service: Gastroenterology;  Laterality: N/A;   TONSILLECTOMY  1997   WISDOM TOOTH EXTRACTION      Social History   Tobacco Use   Smoking status: Never   Smokeless tobacco: Never  Vaping Use   Vaping status: Never Used  Substance Use Topics   Alcohol use: No   Drug use: No    Family History  Problem Relation Age of Onset   Colon cancer Mother        Living   Breast cancer Mother 66       again @ 16 - BrCA +   Healthy  Father        Living   Healthy Daughter        x1   Breast cancer Maternal Grandmother 10   Colon cancer Maternal Grandfather    Alzheimer's disease Paternal Grandmother    Heart disease Paternal Grandfather    Hypertension Paternal Grandfather    Healthy Brother        x1   Healthy Son        x2   Allergies Son    Esophageal cancer Neg Hx    Stomach cancer Neg Hx    Rectal cancer Neg Hx     Allergies  Allergen Reactions   Trazodone  Other (See Comments)    Excessive  drowsiness Confusion Dizziness     Medication list has been reviewed and updated.  Current Outpatient Medications on File Prior to Visit  Medication Sig Dispense Refill   [Paused] aspirin EC 81 MG tablet Take 81 mg by mouth daily.     docusate sodium  (COLACE) 100 MG capsule Take 1 capsule (100 mg total) by mouth daily as needed for mild constipation.     ferrous sulfate  325 (65 FE) MG EC tablet Take 1 tablet (325 mg total) by mouth every other day.     [Paused] losartan  (COZAAR ) 25 MG tablet TAKE 1 TABLET (25 MG TOTAL) BY MOUTH DAILY. 90 tablet 1   oxyCODONE  (OXY IR/ROXICODONE ) 5 MG immediate release tablet Take 1 tablet (5 mg total) by mouth every 4 (four) hours as needed for moderate pain (pain score 4-6). 15 tablet 0   No current facility-administered medications on file prior to visit.    Review of Systems:  As per HPI- otherwise negative.   Physical Examination: There were no vitals filed for this visit. There were no vitals filed for this visit. There is no height or weight on file to calculate BMI. Ideal Body Weight:    GEN: no acute distress. HEENT: Atraumatic, Normocephalic.  Ears and Nose: No external deformity. CV: RRR, No M/G/R. No JVD. No thrill. No extra heart sounds. PULM: CTA B, no wheezes, crackles, rhonchi. No retractions. No resp. distress. No accessory muscle use. ABD: S, NT, ND, +BS. No rebound. No HSM. EXTR: No c/c/e PSYCH: Normally interactive. Conversant.    Assessment and Plan: No diagnosis found.  Assessment & Plan   Signed Harlene Schroeder, MD

## 2023-11-30 ENCOUNTER — Ambulatory Visit (INDEPENDENT_AMBULATORY_CARE_PROVIDER_SITE_OTHER): Admitting: Family Medicine

## 2023-11-30 ENCOUNTER — Encounter: Payer: Self-pay | Admitting: Family Medicine

## 2023-11-30 VITALS — BP 134/86 | HR 82 | Ht 66.0 in | Wt 157.0 lb

## 2023-11-30 DIAGNOSIS — K8050 Calculus of bile duct without cholangitis or cholecystitis without obstruction: Secondary | ICD-10-CM

## 2023-12-01 ENCOUNTER — Encounter: Payer: Self-pay | Admitting: Family Medicine

## 2023-12-01 DIAGNOSIS — R7401 Elevation of levels of liver transaminase levels: Secondary | ICD-10-CM

## 2023-12-01 LAB — COMPREHENSIVE METABOLIC PANEL WITH GFR
ALT: 65 U/L — ABNORMAL HIGH (ref 0–35)
AST: 48 U/L — ABNORMAL HIGH (ref 0–37)
Albumin: 4.3 g/dL (ref 3.5–5.2)
Alkaline Phosphatase: 180 U/L — ABNORMAL HIGH (ref 39–117)
BUN: 13 mg/dL (ref 6–23)
CO2: 26 meq/L (ref 19–32)
Calcium: 9.4 mg/dL (ref 8.4–10.5)
Chloride: 103 meq/L (ref 96–112)
Creatinine, Ser: 0.66 mg/dL (ref 0.40–1.20)
GFR: 100.29 mL/min (ref >60.00)
Glucose, Bld: 84 mg/dL (ref 70–99)
Potassium: 3.9 meq/L (ref 3.5–5.1)
Sodium: 139 meq/L (ref 135–145)
Total Bilirubin: 0.9 mg/dL (ref 0.2–1.2)
Total Protein: 7 g/dL (ref 6.0–8.3)

## 2023-12-01 LAB — CBC
HCT: 38.5 % (ref 36.0–46.0)
Hemoglobin: 12.5 g/dL (ref 12.0–15.0)
MCHC: 32.3 g/dL (ref 30.0–36.0)
MCV: 93.9 fl (ref 78.0–100.0)
Platelets: 283 K/uL (ref 150.0–400.0)
RBC: 4.1 Mil/uL (ref 3.87–5.11)
RDW: 14.5 % (ref 11.5–15.5)
WBC: 5 K/uL (ref 4.0–10.5)

## 2023-12-01 LAB — LIPASE: Lipase: 42 U/L (ref 11.0–59.0)

## 2023-12-01 LAB — AMYLASE: Amylase: 55 U/L (ref 27–131)

## 2023-12-01 NOTE — Telephone Encounter (Signed)
See other my chart message,

## 2023-12-14 ENCOUNTER — Encounter: Payer: Self-pay | Admitting: Family Medicine

## 2023-12-14 ENCOUNTER — Other Ambulatory Visit (INDEPENDENT_AMBULATORY_CARE_PROVIDER_SITE_OTHER)

## 2023-12-14 DIAGNOSIS — R7401 Elevation of levels of liver transaminase levels: Secondary | ICD-10-CM | POA: Diagnosis not present

## 2023-12-14 LAB — HEPATIC FUNCTION PANEL
ALT: 36 U/L — ABNORMAL HIGH (ref 0–35)
AST: 32 U/L (ref 0–37)
Albumin: 4.6 g/dL (ref 3.5–5.2)
Alkaline Phosphatase: 115 U/L (ref 39–117)
Bilirubin, Direct: 0.2 mg/dL (ref 0.0–0.3)
Total Bilirubin: 0.9 mg/dL (ref 0.2–1.2)
Total Protein: 7 g/dL (ref 6.0–8.3)

## 2024-01-16 ENCOUNTER — Ambulatory Visit: Payer: Self-pay | Admitting: Surgery

## 2024-02-05 NOTE — Progress Notes (Unsigned)
 [facility] [address] [place], [place] [address] [phone]  Date:  02/08/2024   Name:  Ruth Cooper   DOB:  February 07, 1971   MRN:  [id number]  PCP:  Watt Harlene BROCKS, MD    Chief Complaint: No chief complaint on file.   History of Present Illness:  Ruth Cooper is a 53 y.o. very pleasant female patient who presents with the following:  Patient seen today with concern of pain.  I saw her most recently in October History of GERD, hypertension,  migraine headache  In September she had an episode of choledocholithiasis which was treated with laparoscopic cholecystectomy and ERCP the following day She was feeling better at her follow-up visit, and her LFTs were trending down We repeated her LFTs about 2 weeks later in mid October and they were back to normal.  She was however dealing with some hemorrhoids which her surgeon was also addressing  Her husband also had a significant stroke over the summer which has been very stressful  Shingrix Due for tetanus booster Colon cancer screen is up-to-date Mammogram up-to-date Pap is up-to-date She has completed her flu shot  Discussed the use of AI scribe software for clinical note transcription with the patient, who gave verbal consent to proceed.  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    Past Surgical History:  Procedure Laterality Date   BACK SURGERY  12/08/2017   cervical disc    CERVICAL DISC ARTHROPLASTY  2020   CHOLECYSTECTOMY N/A 11/14/2023   Procedure: LAPAROSCOPIC CHOLECYSTECTOMY;  Surgeon: Lyndel Deward PARAS, MD;  Location: [facility] OR;  Service: General;  Laterality: N/A;   ENDOMETRIAL ABLATION  2020   ERCP N/A 11/15/2023   Procedure: ERCP, WITH INTERVENTION IF INDICATED;  Surgeon: Wilhelmenia Aloha Raddle., MD;  Location: [facility] ENDOSCOPY;  Service: Gastroenterology;  Laterality: N/A;   TONSILLECTOMY  1997   WISDOM TOOTH EXTRACTION      Social History   Tobacco Use   Smoking status: Never   Smokeless tobacco: Never  Vaping Use   Vaping status: Never Used  Substance Use Topics   Alcohol use: No   Drug use: No    Family History  Problem Relation Age of Onset   Colon cancer Mother        Living   Breast cancer Mother 76       again @ 57 - BrCA +   Healthy Father        Living   Healthy Daughter        x1   Breast cancer Maternal Grandmother 86   Colon cancer Maternal Grandfather    Alzheimer's disease Paternal Grandmother    Heart disease Paternal Grandfather    Hypertension Paternal Grandfather  Healthy Brother        x1   Healthy Son        x2   Allergies Son    Esophageal cancer Neg Hx    Stomach cancer Neg Hx    Rectal cancer Neg Hx     Allergies  Allergen Reactions   Trazodone  Other (See Comments)    Excessive drowsiness Confusion Dizziness     Medication list has been reviewed and updated.  Current Outpatient Medications on File Prior to Visit  Medication Sig Dispense Refill   [Paused] aspirin EC 81 MG tablet Take 81 mg by mouth daily.     docusate sodium  (COLACE) 100 MG capsule Take 1 capsule (100 mg total) by mouth daily as needed for mild constipation.     ferrous sulfate  325 (65 FE)  MG EC tablet Take 1 tablet (325 mg total) by mouth every other day.     [Paused] losartan  (COZAAR ) 25 MG tablet TAKE 1 TABLET (25 MG TOTAL) BY MOUTH DAILY. 90 tablet 1   oxyCODONE  (OXY IR/ROXICODONE ) 5 MG immediate release tablet Take 1 tablet (5 mg total) by mouth every 4 (four) hours as needed for moderate pain (pain score 4-6). 15 tablet 0   No current facility-administered medications on file prior to visit.    Review of Systems:  As per HPI- otherwise negative.   Physical Examination: There were no vitals filed for this visit. There were no vitals filed for this visit. There is no height or weight on file to calculate BMI. Ideal Body Weight:    GEN: no acute distress. HEENT: Atraumatic, Normocephalic.  Ears and Nose: No external deformity. CV: RRR, No M/G/R. No JVD. No thrill. No extra heart sounds. PULM: CTA B, no wheezes, crackles, rhonchi. No retractions. No resp. distress. No accessory muscle use. ABD: S, NT, ND, +BS. No rebound. No HSM. EXTR: No c/c/e PSYCH: Normally interactive. Conversant.    Assessment and Plan: No diagnosis found.  Assessment & Plan   Signed Harlene Schroeder, MD

## 2024-02-08 ENCOUNTER — Ambulatory Visit (HOSPITAL_BASED_OUTPATIENT_CLINIC_OR_DEPARTMENT_OTHER)
Admission: RE | Admit: 2024-02-08 | Discharge: 2024-02-08 | Disposition: A | Discharge status: Home / Self Care | Source: Ambulatory Visit | Attending: Family Medicine | Admitting: Family Medicine

## 2024-02-08 ENCOUNTER — Encounter: Payer: Self-pay | Admitting: Family Medicine

## 2024-02-08 ENCOUNTER — Ambulatory Visit: Admitting: Family Medicine

## 2024-02-08 VITALS — BP 130/84 | HR 70 | Temp 97.6°F | Resp 16 | Ht 66.0 in | Wt 159.2 lb

## 2024-02-08 DIAGNOSIS — G8929 Other chronic pain: Secondary | ICD-10-CM | POA: Diagnosis not present

## 2024-02-08 DIAGNOSIS — M25572 Pain in left ankle and joints of left foot: Secondary | ICD-10-CM | POA: Diagnosis not present

## 2024-02-08 DIAGNOSIS — Z23 Encounter for immunization: Secondary | ICD-10-CM | POA: Diagnosis not present

## 2024-02-08 DIAGNOSIS — M533 Sacrococcygeal disorders, not elsewhere classified: Secondary | ICD-10-CM

## 2024-02-08 NOTE — Patient Instructions (Signed)
 Please stop by imaging on the ground floor to have a tailbone x-ray I will set you up with a foot and ankle specialist orthopedist  Tetanus booster today

## 2024-02-14 ENCOUNTER — Encounter: Payer: Self-pay | Admitting: Family Medicine

## 2024-02-15 ENCOUNTER — Other Ambulatory Visit: Payer: Self-pay | Admitting: Surgery

## 2024-02-17 LAB — SURGICAL PATHOLOGY
